# Patient Record
Sex: Male | Born: 1950 | Race: White | Hispanic: No | Marital: Married | Smoking: Former smoker
Health system: Southern US, Community
[De-identification: ages and names within clinical notes are randomized; demographics above are authoritative.]

## PROBLEM LIST (undated history)

## (undated) DIAGNOSIS — I2510 Atherosclerotic heart disease of native coronary artery without angina pectoris: Secondary | ICD-10-CM

## (undated) DIAGNOSIS — K625 Hemorrhage of anus and rectum: Secondary | ICD-10-CM

## (undated) DIAGNOSIS — J449 Chronic obstructive pulmonary disease, unspecified: Secondary | ICD-10-CM

## (undated) DIAGNOSIS — K219 Gastro-esophageal reflux disease without esophagitis: Secondary | ICD-10-CM

## (undated) DIAGNOSIS — E119 Type 2 diabetes mellitus without complications: Secondary | ICD-10-CM

## (undated) DIAGNOSIS — I10 Essential (primary) hypertension: Secondary | ICD-10-CM

## (undated) DIAGNOSIS — E669 Obesity, unspecified: Secondary | ICD-10-CM

## (undated) DIAGNOSIS — D751 Secondary polycythemia: Secondary | ICD-10-CM

## (undated) DIAGNOSIS — C801 Malignant (primary) neoplasm, unspecified: Secondary | ICD-10-CM

## (undated) DIAGNOSIS — K5790 Diverticulosis of intestine, part unspecified, without perforation or abscess without bleeding: Secondary | ICD-10-CM

## (undated) DIAGNOSIS — I219 Acute myocardial infarction, unspecified: Secondary | ICD-10-CM

## (undated) DIAGNOSIS — E785 Hyperlipidemia, unspecified: Secondary | ICD-10-CM

## (undated) HISTORY — PX: BACK SURGERY: SHX140

## (undated) HISTORY — DX: Obesity, unspecified: E66.9

## (undated) HISTORY — DX: Essential (primary) hypertension: I10

## (undated) HISTORY — DX: Hyperlipidemia, unspecified: E78.5

## (undated) HISTORY — DX: Secondary polycythemia: D75.1

## (undated) HISTORY — DX: Gastro-esophageal reflux disease without esophagitis: K21.9

## (undated) HISTORY — DX: Acute myocardial infarction, unspecified: I21.9

## (undated) HISTORY — PX: CARDIAC CATHETERIZATION: SHX172

## (undated) HISTORY — PX: OTHER SURGICAL HISTORY: SHX169

## (undated) HISTORY — DX: Atherosclerotic heart disease of native coronary artery without angina pectoris: I25.10

---

## 2005-07-26 HISTORY — PX: SHOULDER SURGERY: SHX246

## 2010-05-26 ENCOUNTER — Ambulatory Visit: Payer: Self-pay | Admitting: Internal Medicine

## 2010-05-26 ENCOUNTER — Inpatient Hospital Stay (HOSPITAL_COMMUNITY): Admission: EM | Admit: 2010-05-26 | Discharge: 2010-05-28 | Payer: Self-pay | Admitting: Emergency Medicine

## 2010-05-27 ENCOUNTER — Encounter: Payer: Self-pay | Admitting: Internal Medicine

## 2010-06-11 ENCOUNTER — Encounter: Payer: Self-pay | Admitting: Cardiovascular Disease

## 2010-06-11 ENCOUNTER — Ambulatory Visit: Payer: Self-pay | Admitting: Physician Assistant

## 2010-06-11 DIAGNOSIS — F17200 Nicotine dependence, unspecified, uncomplicated: Secondary | ICD-10-CM

## 2010-06-11 DIAGNOSIS — Z8679 Personal history of other diseases of the circulatory system: Secondary | ICD-10-CM

## 2010-06-11 DIAGNOSIS — I2510 Atherosclerotic heart disease of native coronary artery without angina pectoris: Secondary | ICD-10-CM

## 2010-06-11 DIAGNOSIS — I214 Non-ST elevation (NSTEMI) myocardial infarction: Secondary | ICD-10-CM | POA: Insufficient documentation

## 2010-06-11 DIAGNOSIS — E785 Hyperlipidemia, unspecified: Secondary | ICD-10-CM | POA: Insufficient documentation

## 2010-06-11 DIAGNOSIS — K219 Gastro-esophageal reflux disease without esophagitis: Secondary | ICD-10-CM | POA: Insufficient documentation

## 2010-06-11 DIAGNOSIS — I10 Essential (primary) hypertension: Secondary | ICD-10-CM | POA: Insufficient documentation

## 2010-06-17 ENCOUNTER — Ambulatory Visit: Payer: Self-pay | Admitting: Pulmonary Disease

## 2010-06-17 DIAGNOSIS — I10 Essential (primary) hypertension: Secondary | ICD-10-CM | POA: Insufficient documentation

## 2010-06-17 DIAGNOSIS — I219 Acute myocardial infarction, unspecified: Secondary | ICD-10-CM | POA: Insufficient documentation

## 2010-06-17 DIAGNOSIS — G4733 Obstructive sleep apnea (adult) (pediatric): Secondary | ICD-10-CM

## 2010-06-23 ENCOUNTER — Telehealth (INDEPENDENT_AMBULATORY_CARE_PROVIDER_SITE_OTHER): Payer: Self-pay | Admitting: *Deleted

## 2010-06-29 ENCOUNTER — Telehealth (INDEPENDENT_AMBULATORY_CARE_PROVIDER_SITE_OTHER): Payer: Self-pay | Admitting: *Deleted

## 2010-07-17 ENCOUNTER — Ambulatory Visit (HOSPITAL_BASED_OUTPATIENT_CLINIC_OR_DEPARTMENT_OTHER)
Admission: RE | Admit: 2010-07-17 | Discharge: 2010-07-17 | Payer: Self-pay | Source: Home / Self Care | Attending: Pulmonary Disease | Admitting: Pulmonary Disease

## 2010-07-17 ENCOUNTER — Encounter: Payer: Self-pay | Admitting: Pulmonary Disease

## 2010-07-30 ENCOUNTER — Telehealth (INDEPENDENT_AMBULATORY_CARE_PROVIDER_SITE_OTHER): Payer: Self-pay | Admitting: *Deleted

## 2010-07-31 ENCOUNTER — Ambulatory Visit
Admission: RE | Admit: 2010-07-31 | Discharge: 2010-07-31 | Payer: Self-pay | Source: Home / Self Care | Attending: Internal Medicine | Admitting: Internal Medicine

## 2010-07-31 ENCOUNTER — Other Ambulatory Visit: Payer: Self-pay | Admitting: Internal Medicine

## 2010-07-31 ENCOUNTER — Telehealth: Payer: Self-pay | Admitting: Internal Medicine

## 2010-07-31 LAB — LIPID PANEL
Cholesterol: 113 mg/dL (ref 0–200)
HDL: 25.5 mg/dL — ABNORMAL LOW (ref >39.00)
Total CHOL/HDL Ratio: 4
Triglycerides: 399 mg/dL — ABNORMAL HIGH (ref 0.0–149.0)
VLDL: 79.8 mg/dL — ABNORMAL HIGH (ref 0.0–40.0)

## 2010-07-31 LAB — HEPATIC FUNCTION PANEL
ALT: 31 U/L (ref 0–53)
AST: 23 U/L (ref 0–37)
Albumin: 4.1 g/dL (ref 3.5–5.2)
Alkaline Phosphatase: 101 U/L (ref 39–117)
Bilirubin, Direct: 0.1 mg/dL (ref 0.0–0.3)
Total Bilirubin: 0.7 mg/dL (ref 0.3–1.2)
Total Protein: 6.9 g/dL (ref 6.0–8.3)

## 2010-07-31 LAB — LDL CHOLESTEROL, DIRECT: Direct LDL: 52 mg/dL

## 2010-08-04 ENCOUNTER — Ambulatory Visit
Admission: RE | Admit: 2010-08-04 | Discharge: 2010-08-04 | Payer: Self-pay | Source: Home / Self Care | Attending: Pulmonary Disease | Admitting: Pulmonary Disease

## 2010-08-25 NOTE — Progress Notes (Signed)
Summary: schedule sleep study   Phone Note Call from Patient Call back at Home Phone [phone]   Caller: Patient Call For: clance Summary of Call: Pt states KC wants him to schedule a sleep study. Initial call taken by: Darletta Moll,  June 23, 2010 2:37 PM  Follow-up for Phone Call        Per pt instructions from Ov on 11.23.11, KC would recommend a sleep study to evaluate for sleep apnea.  Pt to let [facility] know when he would like to schedule this.   Called, spoke with pt.  He states he is ready to schedule sleep study.  Requesting this be done on a Friday night.  Aware KC out of office today but will return tomorrow.  He is ok with this and requesting call back tomorrow after 2:30pm.  Dr. Shelle Iron, pls advise.  Thanks! Follow-up by: Gweneth Dimitri RN,  June 23, 2010 3:12 PM  Additional Follow-up for Phone Call Additional follow up Details #1::        order sent for npsg, but let him know very unlikely he can do it this friday. Additional Follow-up by: Barbaraann Share MD,  June 24, 2010 6:50 AM    Additional Follow-up for Phone Call Additional follow up Details #2::    [facility] Gweneth Dimitri RN  June 24, 2010 8:43 AM [facility].Michel Bickers CMA  June 25, 2010 10:07 AM [facility] to inform pt order sent for npsg.  This is the 3rd attempt to contact pt.  Per triage protocol, will sign off on this note. Gweneth Dimitri RN  June 26, 2010 5:05 PM

## 2010-08-25 NOTE — Cardiovascular Report (Signed)
Summary: [place] [facility]   [facility]   Imported By: Roderic Ovens 06/08/2010 09:23:17  _____________________________________________________________________  External Attachment:    Type:   Image     Comment:   External Document

## 2010-08-25 NOTE — Assessment & Plan Note (Signed)
Summary: EPH/POST MI   Visit Type:  EPH  CC:  no complaints.  History of Present Illness: Primary Cardiologist:  Dr. Arvilla Meres   Jose Burns is a 60 year old male with no prior history of coronary disease who presented to [facility] on November 2 with chest discomfort.  He ruled in for myocardial infarction.  He underwent cardiac catheterization demonstrating severe 2 vessel CAD.  He was treated with a drug-eluting stent to the LAD and second obtuse marginal.  He had residual disease in the first diagonal of 80%.  He would require myoview testing if he has recurrent chest symptoms.  If he has diagonal territory ischemia, then PCI of the diagonal could be consideredl.  A question of sleep apnea was raised during his admission and he has been set up for an outpatient sleep test.  He returns for follow up.  Since discharge, he is doing well.  He states that he has an occasional "twinge" in his chest.  However, he denies any chest discomfort similar to his previous angina.  He has been walking without chest discomfort or shortness of breath.  He denies orthopnea, PND or edema.  He denies syncope.  He is tolerating his medications well.  He has had some vivid dreams.  He has not been contacted about cardiac rehabilitation.  He is no longer smoking.  Current Medications (verified): 1)  Aspirin Ec 325 Mg Tbec (Aspirin) .... Take One Tablet By Mouth Daily 2)  Effient 10 Mg Tabs (Prasugrel Hcl) .... Take One Tablet By Mouth Daily 3)  Metoprolol Succinate 25 Mg Xr24h-Tab (Metoprolol Succinate) .... Take One Tablet By Mouth Daily 4)  Lipitor 80 Mg Tabs (Atorvastatin Calcium) .... Take One Tablet By Mouth Daily. 5)  Nitrostat 0.4 Mg Subl (Nitroglycerin) .Marland Kitchen.. 1 Tablet Under Tongue At Onset of Chest Pain; You May Repeat Every 5 Minutes For Up To 3 Doses.  Allergies (verified): No Known Drug Allergies  Past History:  Past Medical History: CAD    a.  s/p Non-ST-segment elevation  myocardial infarction 05/27/2010    b.  NSTEMI tx'd with Promus DES to LAD and Promus DES to OM2    c.  residual at cath 05/2010:  OM3 70% (small); D1 80%; CFx 40-50%; EF 55% Hyperlipidemia.  h/o Polycythemia with question of sleep apnea.  Obesity.  Gastroesophageal reflux disease.   Past Surgical History: Reviewed history from 06/10/2010 and no changes required. S/P back surgery 2009 S/P shoulder surgery 2007  Family History: Reviewed history from 06/10/2010 and no changes required. Negative for premature diagnosis of coronary artery disease.   Social History: Reviewed history from 06/10/2010 and no changes required. The patient lives in [place] with his wife  He is a part-time [employer].   He has approximately 80 pack-year smoking history currently smoking 1-1/2 - 2 ppd.  Rarely consumes EtOH  Denies any binge drinking.  No illicit drugs.  No herbal meds.  Regular diet.   No regular exercise.   Review of Systems       He denies leg pain c/w claudication.  Vital Signs:  Patient profile:   60 year old male Height:      70 inches Weight:      227 pounds BMI:     32.69 Pulse rate:   89 / minute BP sitting:   157 / 89  (left arm) Cuff size:   regular  Vitals Entered By: Caralee Ates CMA (June 11, 2010 9:33 AM)  Physical Exam  General:  Well nourished, well developed, in no acute distress HEENT: normal Neck: no JVD Cardiac:  normal S1, S2; RRR; no murmur Lungs:  clear to auscultation bilaterally, no wheezing, rhonchi or rales Abd: soft, nontender, no hepatomegaly Ext: no edema; R radial site without hematoma or bruit Vascular: no carotid  bruits Skin: warm and dry Neuro:  CNs 2-12 intact, no focal abnormalities noted    EKG  Procedure date:  06/11/2010  Findings:      Normal sinus rhythm Heart rate 70 Normal axis Poor R-wave progression No ischemic changes  Impression & Recommendations:  Problem # 1:  ACUT MI SUBENDOCARDIAL INFARCT EPIS CARE UNS  (ICD-410.70) Doing well post myocardial infarction. Continue aspirin and Effient.  He will need to remain on dual antiplatelet therapy for a minimum of one year. We will give him an order to start cardiac rehabilitation at the hospital in [place] where he lives. He will be given a note to return to work next week.  Orders: EKG w/ Interpretation (93000)  Problem # 2:  CORONARY ATHEROSCLEROSIS NATIVE CORONARY ARTERY (ICD-414.01)  No anginal symptoms.  Problem # 3:  HYPERLIPIDEMIA (ICD-272.4)  Check FLP and LFTs in 6 weeks.  His updated medication list for this problem includes:    Lipitor 80 Mg Tabs (Atorvastatin calcium) .Marland Kitchen... Take one tablet by mouth daily.  Problem # 4:  RISK OF SLEEP APNEA (ICD-V12.59)  Sleep study pending  Problem # 5:  ESSENTIAL HYPERTENSION, BENIGN (ICD-401.1)  Increase Toprol to 50 mg once daily. Consider ACE in future if BP not optimal.  Patient Instructions: 1)  Your physician recommends that you schedule a follow-up appointment in: 6 weeks with Dr. Gala Romney 2)  Your physician recommends that you return for a FASTING lipid profile: Fasting Lipids,LFT's in 6 weeks same day of F/U visit. 3)  Your physician has recommended you make the following change in your medication: Metorolol 50 mg. Take one tablet daily. 4)  Cardiac rehab. in [place]. Prescription given. Prescriptions: NITROSTAT 0.4 MG SUBL (NITROGLYCERIN) 1 tablet under tongue at onset of chest pain; you may repeat every 5 minutes for up to 3 doses.  #25 x 3   Entered by:   Ollen Gross, RN, BSN   Authorized by:   Tereso Newcomer PA-C   Signed by:   Ollen Gross, RN, BSN on 06/11/2010   Method used:   Print then Give to Patient   RxID:   [id number] LIPITOR 80 MG TABS (ATORVASTATIN CALCIUM) Take one tablet by mouth daily.  #90 x 3   Entered by:   Ollen Gross, RN, BSN   Authorized by:   Tereso Newcomer PA-C   Signed by:   Ollen Gross, RN, BSN on 06/11/2010   Method used:    Print then Give to Patient   RxID:   [id number] EFFIENT 10 MG TABS (PRASUGREL HCL) Take one tablet by mouth daily  #90 x 3   Entered by:   Ollen Gross, RN, BSN   Authorized by:   Tereso Newcomer PA-C   Signed by:   Ollen Gross, RN, BSN on 06/11/2010   Method used:   Print then Give to Patient   RxID:   [id number] METOPROLOL SUCCINATE 50 MG XR24H-TAB (METOPROLOL SUCCINATE) Take one tablet by mouth daily  #90 x 6   Entered by:   Ollen Gross, RN, BSN   Authorized by:   Tereso Newcomer PA-C   Signed by:   Ollen Gross, RN, BSN on 06/11/2010  Method used:   Print then Give to Patient   RxID:   [id number]  I have personally reviewed the prescriptions today for accuracy.Tereso Newcomer PA-C  June 11, 2010 5:05 PM

## 2010-08-25 NOTE — Letter (Signed)
Summary: Return To Work  [employer], Main Office  [address]   [place], [place] [address]   Phone: [phone]  Fax: [phone]    06/11/2010  TO: Leodis Sias IT MAY CONCERN   RE: Jose Burns [address] [id number]   The above named individual is under my medical care and may return to work [id number] 06/15/10  If you have any further questions or need additional information, please call.     Sincerely,    Ollen Gross, RN, BSN

## 2010-08-25 NOTE — Progress Notes (Signed)
Summary: returning call-pt aware PSG sched  Phone Note Call from Patient Call back at Home Phone [phone]   Caller: Patient Call For: clance Summary of Call: Returning call. Initial call taken by: Darletta Moll,  June 29, 2010 2:37 PM  Follow-up for Phone Call        I called and spoke with pt.  He states that he is already aware of sleep study date and time and denied any questions/concerns.  Nothing needed. Follow-up by: Vernie Murders,  June 29, 2010 3:22 PM

## 2010-08-25 NOTE — Assessment & Plan Note (Signed)
Summary: consult for possible osa   Visit Type:  Initial Consult Copy to:  Arvilla Meres MD Primary Provider/Referring Provider:  none per pt  CC:  sleep consult. Pt wife states he snores bad.  History of Present Illness: The pt is a 60y/o male who I have been asked to see for possible osa.  He has recently had a MI requiring PCI, and has a longstanding h/o snoring with abnormal breathing pattern during sleep.  This was apparently noticed in the past after back surgery, and with his recent hospitalization.  His wife has witnessed this at times while napping on the couch.  He is a loud snorer per wife.  He typically goes to bed at 10-11pm, and arises at 6am to start his day.  He awakens 2-3 times a night, but feels he is adequately rested in the am's upon arising.  He is not sleepy during the day while active, but does note sleep pressure and dozing during the day with periods of inactivity.  He also notes some sleep pressure driving  more than a few hours.  His epworth score today is only 8, and he tells me his weight is up only 5 pounds over the last 2 years.  Current Medications (verified): 1)  Aspirin Ec 325 Mg Tbec (Aspirin) .... Take One Tablet By Mouth Daily 2)  Effient 10 Mg Tabs (Prasugrel Hcl) .... Take One Tablet By Mouth Daily 3)  Lipitor 80 Mg Tabs (Atorvastatin Calcium) .... Take One Tablet By Mouth Daily. 4)  Nitrostat 0.4 Mg Subl (Nitroglycerin) .Marland Kitchen.. 1 Tablet Under Tongue At Onset of Chest Pain; You May Repeat Every 5 Minutes For Up To 3 Doses. 5)  Metoprolol Succinate 50 Mg Xr24h-Tab (Metoprolol Succinate) .... Take One Tablet By Mouth Daily  Allergies (verified): No Known Drug Allergies  Past History:  Past Medical History: CAD    a.  s/p Non-ST-segment elevation myocardial infarction 05/27/2010    b.  NSTEMI tx'd with Promus DES to LAD and Promus DES to OM2    c.  residual at cath 05/2010:  OM3 70% (small); D1 80%; CFx 40-50%; EF 55% Hyperlipidemia.  h/o Polycythemia  with question of sleep apnea.  Obesity.  Gastroesophageal reflux disease.  Hypertension Myocardial Infarction  Past Surgical History: S/P back surgery x2 S/P shoulder surgery 2007 stents x2  Family History: Reviewed history from 06/11/2010 and no changes required. Negative for premature diagnosis of coronary artery disease.  Father: Deceased age 47 - [place] Mother: Alive age 89 Sister: Deceased age 51 - Heart Attach breast cancer--mother  Social History: Reviewed history from 06/11/2010 and no changes required. The patient lives in [place] with his wife  He is a part-time [employer] architect.   quit smoking 05/26/2010. 1/2-1 ppd. started 1970's.  Rarely consumes EtOH  Denies any binge drinking.  No illicit drugs.  No herbal meds.  Regular diet.   No regular exercise.  Tobacco Use - Former.  Retired  Part Time  Married   Review of Systems  The patient denies shortness of breath with activity, shortness of breath at rest, productive cough, non-productive cough, coughing up blood, chest pain, irregular heartbeats, acid heartburn, indigestion, loss of appetite, weight change, abdominal pain, difficulty swallowing, sore throat, tooth/dental problems, headaches, nasal congestion/difficulty breathing through nose, sneezing, itching, ear ache, anxiety, depression, hand/feet swelling, joint stiffness or pain, rash, change in color of mucus, and fever.    Vital Signs:  Patient profile:   60 year old male Height:  70 inches Weight:      227.38 pounds O2 Sat:      93 % on Room air Temp:     97.8 degrees F oral Pulse rate:   69 / minute BP sitting:   112 / 66  (left arm) Cuff size:   regular  Vitals Entered By: Carver Fila (June 17, 2010 1:49 PM)  O2 Flow:  Room air CC: sleep consult. Pt wife states he snores bad Comments meds and allergies updated Phone number updated Carver Fila  June 17, 2010 1:50 PM    Physical Exam  General:  ow male in nad Eyes:  PERRLA and  EOMI.   Nose:  mildly narrowed but patent Mouth:  long palate and uvula, small posterior pharyngeal space. Neck:  no jvd, tmg, LN Lungs:  clear to auscultation Heart:  rrr, no mrg Abdomen:  soft and nontender, bs+ Extremities:  no significant edema, no cyanosis  pulses intact distally Neurologic:  alert and oriented, moves all 4.   Impression & Recommendations:  Problem # 1:  OBSTRUCTIVE SLEEP APNEA (ICD-327.23)  the pt's history is very suggestive of osa.  He is overweight with a large neck and abnormal UA anatomy, has known loud snoring and witnessed abnormal breathing pattern during sleep, and finally has EDS with periods of inactivity.  He has underlying CV disease, and would benefit from treatment if he does indeed have osa.  I have had a long discussion with the pt about sleep apnea, including its impact on QOL and CV health.  I have recommended a sleep study to the pt, but he feels he is too busy to schedule for now.  He would be willing to do it after the first of the year, so I have asked him to call me to schedule.  I have also encouraged him to work on weight loss.  Other Orders: Consultation Level IV (04540)  Patient Instructions: 1)  would recommend a sleep study to evaluate for sleep apnea.  Please let me know when you would like to schedule. 2)  work on weight loss

## 2010-08-27 NOTE — Progress Notes (Signed)
Summary: need ov with KC---LMTCBX1-pt returned call  Phone Note Outgoing Call Call back at [facility] Phone [phone]   Call placed by: Arman Filter LPN,  July 30, 2010 9:30 AM Call placed to: Patient Summary of Call: pt needs ov with KC to discuss sleep study results.  LMOMTCBX1.   Can you hold slots on the following days..... Wed Jan 18 at 3:15pm Thurs Jan 19 at 10:45 am, 11:30 am, or 3:30pm  Arman Filter LPN  July 30, 2010 9:32 AM  Initial call taken by: Arman Filter LPN,  July 30, 2010 9:32 AM  Follow-up for Phone Call        pt returned call from [place]. Tivis Ringer, CNA  July 30, 2010 2:44 PM  pt scheduled to see [facility] next Tuesday Jan 10 at 10:45am.  Arman Filter LPN  July 30, 2010 4:49 PM

## 2010-08-27 NOTE — Assessment & Plan Note (Signed)
Summary: rov for review of npsg   Visit Type:  Follow-up Copy to:  Arvilla Meres MD Primary Provider/Referring Provider:  none per pt  CC:  Follow-up sleep test.  History of Present Illness: The pt comes in today for f/u of his recent sleep study.  He was found to have very mild osa, with an AHI of 7/hr and desat transiently as low as 81%.  I have reviewed the study in detail with him, and answered all of his questions.  Current Medications (verified): 1)  Aspirin Ec 325 Mg Tbec (Aspirin) .... Take One Tablet By Mouth Daily 2)  Effient 10 Mg Tabs (Prasugrel Hcl) .... Take One Tablet By Mouth Daily 3)  Lipitor 80 Mg Tabs (Atorvastatin Calcium) .... Take One Tablet By Mouth Daily. 4)  Nitrostat 0.4 Mg Subl (Nitroglycerin) .Marland Kitchen.. 1 Tablet Under Tongue At Onset of Chest Pain; You May Repeat Every 5 Minutes For Up To 3 Doses. 5)  Metoprolol Succinate 50 Mg Xr24h-Tab (Metoprolol Succinate) .... Take One Tablet By Mouth Daily 6)  Viagra 100 Mg Tabs (Sildenafil Citrate) .... As Needed 7)  Tylenol Extra Strength 500 Mg Tabs (Acetaminophen) .Marland Kitchen.. 1 By Mouth Daily As Needed  Allergies (verified): No Known Drug Allergies  Past History:  Past medical, surgical, family and social histories (including risk factors) reviewed, and no changes noted (except as noted below).  Past Medical History: Reviewed history from 06/17/2010 and no changes required. CAD    a.  s/p Non-ST-segment elevation myocardial infarction 05/27/2010    b.  NSTEMI tx'd with Promus DES to LAD and Promus DES to OM2    c.  residual at cath 05/2010:  OM3 70% (small); D1 80%; CFx 40-50%; EF 55% Hyperlipidemia.  h/o Polycythemia with question of sleep apnea.  Obesity.  Gastroesophageal reflux disease.  Hypertension Myocardial Infarction  Past Surgical History: Reviewed history from 06/17/2010 and no changes required. S/P back surgery x2 S/P shoulder surgery 2007 stents x2  Family History: Reviewed history from  06/17/2010 and no changes required. Negative for premature diagnosis of coronary artery disease.  Father: Deceased age 2 - [place] Mother: Alive age 30 Sister: Deceased age 78 - Heart Attach breast cancer--mother  Social History: Reviewed history from 06/17/2010 and no changes required. The patient lives in [place] with his wife  He is a part-time [employer] architect.   quit smoking 05/26/2010. 1/2-1 ppd. started 1970's.  Rarely consumes EtOH  Denies any binge drinking.  No illicit drugs.  No herbal meds.  Regular diet.   No regular exercise.  Tobacco Use - Former.  Retired  Part Time  Married   Vital Signs:  Patient profile:   60 year old male Height:      70 inches (177.80 cm) Weight:      235.25 pounds (106.93 kg) BMI:     33.88 O2 Sat:      97 % on Room air Temp:     97.8 degrees F (36.56 degrees C) oral Pulse rate:   65 / minute BP sitting:   122 / 80  (left arm) Cuff size:   large  Vitals Entered By: Michel Bickers CMA (August 04, 2010 10:57 AM)  O2 Sat at Rest %:  97 O2 Flow:  Room air CC: Follow-up sleep test Comments Medications reviewed with patient Michel Bickers CMA  August 04, 2010 10:58 AM   Physical Exam  General:  ow male in nad Extremities:  no edema, no cyanosis  Neurologic:  alert, does not appear  sleepy, moves all 4.   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  The good news here is this degree of osa has very little impact on CV health.  I have reviewed the various treatment options for mild osa, including a trial of weight loss alone, upper airway surgery, dental appliance, and cpap.  He really does not have upper airway anatomy that is amenable to surgery, therefore would need to consider the other options. At this point, he would like to work on weight loss alone and see how he does.    Medications Added to Medication List This Visit: 1)  Tylenol Extra Strength  500 Mg Tabs (Acetaminophen) .Marland Kitchen.. 1 by mouth daily as needed  Other Orders: Est. Patient Level III (40981)  Patient Instructions: 1)  work on weight loss 2)  if you wish to treat your sleep apnea more aggressively, would consider dental appliance or cpap.  Just let me know.

## 2010-08-27 NOTE — Progress Notes (Addendum)
Summary: prior auth for viagra  Phone Note Other Incoming   Summary of Call: received form from for prior auth for pts viagra have called [phone] pts ID # [id number] and was denied b/c pt has as needed ntg, explained pt has been counceled and understands not to take meds together, but still they denied they state Dr Gala Romney can send a letter for appeal to [employer] at fax # [phone] Initial call taken by: Meredith Staggers, RN,  July 31, 2010 5:57 PM

## 2010-08-27 NOTE — Assessment & Plan Note (Signed)
Summary: 6wk f/u sl   Visit Type:  Follow-up Referring Provider:  Arvilla Meres MD Primary Provider:  none per pt  CC:  no complaints.  History of Present Illness: Jose Burns is a 60 year old male obesity, HTN, previous tobacco use and CAD. He had NSTEMI in Nov 2011.  He underwent cardiac catheterization demonstrating severe 2 vessel CAD.  He was treated with a drug-eluting stent to the LAD and second obtuse marginal.  He had residual disease in the first diagonal of 80%.  A question of sleep apnea was raised during his admission due to polycythemia and he has been set up for an outpatient sleep test.  He returns for follow up.  Since discharge, he is doing well.  He states that he has an occasional "twinge" in his chest.  However, he denies any chest discomfort similar to his previous angina.  He has been walking without chest discomfort or shortness of breath. Says he is too lazy to check on cardiac rehab. Had his sleeps study and is scheduled to f/u with Dr. Shelle Iron.    He denies orthopnea, PND or edema.  He denies syncope.  He is tolerating his medications well.  He is no longer smoking (67 days). Using nicotine patch. Has gained 5 pounds.  Current Medications (verified): 1)  Aspirin Ec 325 Mg Tbec (Aspirin) .... Take One Tablet By Mouth Daily 2)  Effient 10 Mg Tabs (Prasugrel Hcl) .... Take One Tablet By Mouth Daily 3)  Lipitor 80 Mg Tabs (Atorvastatin Calcium) .... Take One Tablet By Mouth Daily. 4)  Nitrostat 0.4 Mg Subl (Nitroglycerin) .Marland Kitchen.. 1 Tablet Under Tongue At Onset of Chest Pain; You May Repeat Every 5 Minutes For Up To 3 Doses. 5)  Metoprolol Succinate 50 Mg Xr24h-Tab (Metoprolol Succinate) .... Take One Tablet By Mouth Daily  Allergies (verified): No Known Drug Allergies  Review of Systems       As per HPI and past medical history; otherwise all systems negative.   Vital Signs:  Patient profile:   60 year old male Height:      70 inches Weight:      232.25 pounds BMI:      33.44 Pulse rate:   65 / minute BP sitting:   126 / 70  (left arm) Cuff size:   regular  Vitals Entered By: Hardin Negus, RMA (July 31, 2010 9:45 AM)  Physical Exam  General:  Gen: well appearing. no resp difficulty HEENT: normal Neck: supple. no JVD. Carotids 2+ bilat; no bruits. No lymphadenopathy or thryomegaly appreciated. Cor: PMI nondisplaced. Regular rate & rhythm. No rubs, gallops, murmur. Lungs: clear Abdomen:obese. Good bowel sounds. Extremities: no cyanosis, clubbing, rash, edema Neuro: alert & orientedx3, cranial nerves grossly intact. moves all 4 extremities w/o difficulty. affect pleasant    Impression & Recommendations:  Problem # 1:  CORONARY ATHEROSCLEROSIS NATIVE CORONARY ARTERY (ICD-414.01) Doing well. Continue current regimen including Effient.  Encouraged him to go to cardiac rehab.  Problem # 2:  HYPERTENSION (ICD-401.9) Blood pressure well controlled. Continue current regimen.  Problem # 3:  ERECTILE DYSFUNCTION Have prescribed sildenafil. Warned never to take NTG when sildenafil on board. If severe CP with sildenafil on board call 911.   Problem # 4:  OBSTRUCTIVE SLEEP APNEA (ICD-327.23) Suspect he has severe OSA. F/u with Dr. Shelle Iron  Problem # 5:  TOBACCO ABUSE (ICD-305.1) Congratulated him on smoking cessation.  Other Orders: TLB-Lipid Panel (80061-LIPID) TLB-Hepatic/Liver Function Pnl (80076-HEPATIC)  Patient Instructions: 1)  We have sent  you a prescription for Viagra 100mg , please remember to not take nitroglycerin when you have taken this medication. 2)  Your physician recommends referral and attendance at a Cardiac Rehab Program. 3)  Your physician wants you to follow-up in:  6 months.  You will receive a reminder letter in the mail two months in advance. If you don't receive a letter, please call our office to schedule the follow-up appointment. Prescriptions: VIAGRA 100 MG TABS (SILDENAFIL CITRATE) as needed  #8 x 4   Entered by:    Meredith Staggers, RN   Authorized by:   Dolores Patty, MD, [facility]   Signed by:   Meredith Staggers, RN on 07/31/2010   Method used:   Electronically to        [facility] (retail)       [address]       [place], [place]  [address]       Ph: [phone]       Fax: [phone]   RxID:   [id number]

## 2010-10-06 LAB — CBC
HCT: 50.8 % (ref 39.0–52.0)
HCT: 53.5 % — ABNORMAL HIGH (ref 39.0–52.0)
Hemoglobin: 16.9 g/dL (ref 13.0–17.0)
Hemoglobin: 17.9 g/dL — ABNORMAL HIGH (ref 13.0–17.0)
MCH: 32 pg (ref 26.0–34.0)
MCH: 32.8 pg (ref 26.0–34.0)
MCHC: 33.3 g/dL (ref 30.0–36.0)
MCHC: 33.5 g/dL (ref 30.0–36.0)
MCV: 97.9 fL (ref 78.0–100.0)
MCV: 98.2 fL (ref 78.0–100.0)
Platelets: 189 10*3/uL (ref 150–400)
Platelets: 203 10*3/uL (ref 150–400)
RBC: 5.2 MIL/uL (ref 4.22–5.81)
RBC: 5.45 MIL/uL (ref 4.22–5.81)
RDW: 13.6 % (ref 11.5–15.5)
RDW: 13.6 % (ref 11.5–15.5)
WBC: 10 10*3/uL (ref 4.0–10.5)

## 2010-10-06 LAB — POCT CARDIAC MARKERS
CKMB, poc: 1 ng/mL (ref 1.0–8.0)
Myoglobin, poc: 101 ng/mL (ref 12–200)
Troponin i, poc: <0.05 ng/mL (ref 0.00–0.09)

## 2010-10-06 LAB — PLATELET INHIBITION P2Y12
P2Y12 % Inhibition: 97 %
Platelet Function  P2Y12: 7 [PRU] — ABNORMAL LOW (ref 194–418)
Platelet Function Baseline: 216 [PRU] (ref 194–418)

## 2010-10-06 LAB — DIFFERENTIAL
Basophils Absolute: 0.1 10*3/uL (ref 0.0–0.1)
Basophils Relative: 1 % (ref 0–1)
Eosinophils Absolute: 0.3 10*3/uL (ref 0.0–0.7)
Eosinophils Relative: 3 % (ref 0–5)
Lymphocytes Relative: 16 % (ref 12–46)
Lymphs Abs: 1.6 10*3/uL (ref 0.7–4.0)
Monocytes Absolute: 0.8 10*3/uL (ref 0.1–1.0)
Monocytes Relative: 8 % (ref 3–12)
Neutro Abs: 7.2 10*3/uL (ref 1.7–7.7)
Neutrophils Relative %: 72 % (ref 43–77)

## 2010-10-06 LAB — POCT I-STAT, CHEM 8
BUN: 11 mg/dL (ref 6–23)
Calcium, Ion: 1.1 mmol/L — ABNORMAL LOW (ref 1.12–1.32)
Chloride: 102 mEq/L (ref 96–112)
Creatinine, Ser: 1.1 mg/dL (ref 0.4–1.5)
Glucose, Bld: 128 mg/dL — ABNORMAL HIGH (ref 70–99)
HCT: 57 % — ABNORMAL HIGH (ref 39.0–52.0)
Hemoglobin: 19.4 g/dL — ABNORMAL HIGH (ref 13.0–17.0)
Potassium: 4.7 mEq/L (ref 3.5–5.1)
Sodium: 137 meq/L (ref 135–145)
TCO2: 29 mmol/L (ref 0–100)

## 2010-10-06 LAB — BASIC METABOLIC PANEL
GFR calc Af Amer: >60 mL/min (ref >60)
GFR calc non Af Amer: >60 mL/min (ref >60)
Glucose, Bld: 119 mg/dL — ABNORMAL HIGH (ref 70–99)
Potassium: 3.8 mEq/L (ref 3.5–5.1)
Sodium: 137 mEq/L (ref 135–145)

## 2010-10-06 LAB — LIPID PANEL
Total CHOL/HDL Ratio: 5.3 RATIO
VLDL: 40 mg/dL (ref 0–40)

## 2010-10-06 LAB — HEPATIC FUNCTION PANEL
ALT: 22 U/L (ref 0–53)
AST: 22 U/L (ref 0–37)
Albumin: 4 g/dL (ref 3.5–5.2)
Alkaline Phosphatase: 88 U/L (ref 39–117)
Bilirubin, Direct: 0.1 mg/dL (ref 0.0–0.3)
Indirect Bilirubin: 0.8 mg/dL (ref 0.3–0.9)
Total Bilirubin: 0.9 mg/dL (ref 0.3–1.2)
Total Protein: 7 g/dL (ref 6.0–8.3)

## 2010-10-06 LAB — CARDIAC PANEL(CRET KIN+CKTOT+MB+TROPI)
CK, MB: 2.5 ng/mL (ref 0.3–4.0)
CK, MB: 4.5 ng/mL — ABNORMAL HIGH (ref 0.3–4.0)
Total CK: 130 U/L (ref 7–232)
Total CK: 135 U/L (ref 7–232)
Troponin I: 0.12 ng/mL — ABNORMAL HIGH (ref 0.00–0.06)

## 2010-10-06 LAB — HEPARIN LEVEL (UNFRACTIONATED)
Heparin Unfractionated: <0.1 IU/mL — ABNORMAL LOW (ref 0.30–0.70)
Heparin Unfractionated: <0.1 IU/mL — ABNORMAL LOW (ref 0.30–0.70)

## 2010-10-06 LAB — HEMOGLOBIN A1C
Hgb A1c MFr Bld: 5.9 % — ABNORMAL HIGH (ref <5.7)
Mean Plasma Glucose: 123 mg/dL — ABNORMAL HIGH (ref <117)

## 2010-10-06 LAB — CK TOTAL AND CKMB (NOT AT ARMC)
CK, MB: 4.4 ng/mL — ABNORMAL HIGH (ref 0.3–4.0)
Relative Index: 2.5 (ref 0.0–2.5)
Total CK: 173 U/L (ref 7–232)

## 2010-10-06 LAB — APTT: aPTT: 31 seconds (ref 24–37)

## 2010-10-06 LAB — PROTIME-INR: INR: 0.99 (ref 0.00–1.49)

## 2010-10-06 LAB — TROPONIN I: Troponin I: 0.14 ng/mL — ABNORMAL HIGH (ref 0.00–0.06)

## 2010-10-09 ENCOUNTER — Other Ambulatory Visit (INDEPENDENT_AMBULATORY_CARE_PROVIDER_SITE_OTHER): Payer: BC Managed Care – PPO

## 2010-10-09 ENCOUNTER — Other Ambulatory Visit: Payer: Self-pay | Admitting: Internal Medicine

## 2010-10-09 ENCOUNTER — Encounter: Payer: Self-pay | Admitting: Internal Medicine

## 2010-10-09 DIAGNOSIS — Z79899 Other long term (current) drug therapy: Secondary | ICD-10-CM

## 2010-10-09 DIAGNOSIS — E785 Hyperlipidemia, unspecified: Secondary | ICD-10-CM

## 2010-10-09 LAB — HEPATIC FUNCTION PANEL
Alkaline Phosphatase: 97 U/L (ref 39–117)
Bilirubin, Direct: 0.1 mg/dL (ref 0.0–0.3)
Total Bilirubin: 0.5 mg/dL (ref 0.3–1.2)
Total Protein: 6.9 g/dL (ref 6.0–8.3)

## 2010-10-09 LAB — LIPID PANEL
HDL: 28 mg/dL — ABNORMAL LOW (ref >39.00)
Total CHOL/HDL Ratio: 4
Triglycerides: 377 mg/dL — ABNORMAL HIGH (ref 0.0–149.0)
VLDL: 75.4 mg/dL — ABNORMAL HIGH (ref 0.0–40.0)

## 2010-10-09 LAB — LDL CHOLESTEROL, DIRECT: Direct LDL: 45.6 mg/dL

## 2010-10-22 ENCOUNTER — Telehealth: Payer: Self-pay | Admitting: Internal Medicine

## 2010-10-22 DIAGNOSIS — E785 Hyperlipidemia, unspecified: Secondary | ICD-10-CM

## 2010-10-22 NOTE — Telephone Encounter (Signed)
Pt rtn call re lab work results

## 2010-10-22 NOTE — Telephone Encounter (Signed)
Pt given lab results, he will increase fish oil as directed and will have rechecked on 4/27

## 2010-11-20 ENCOUNTER — Other Ambulatory Visit (INDEPENDENT_AMBULATORY_CARE_PROVIDER_SITE_OTHER): Payer: BC Managed Care – PPO | Admitting: *Deleted

## 2010-11-20 ENCOUNTER — Other Ambulatory Visit (INDEPENDENT_AMBULATORY_CARE_PROVIDER_SITE_OTHER): Payer: BC Managed Care – PPO | Admitting: Internal Medicine

## 2010-11-20 DIAGNOSIS — E785 Hyperlipidemia, unspecified: Secondary | ICD-10-CM

## 2010-11-20 LAB — LIPID PANEL: Cholesterol: 113 mg/dL (ref 0–200)

## 2010-11-20 LAB — HEPATIC FUNCTION PANEL
ALT: 40 U/L (ref 0–53)
AST: 32 U/L (ref 0–37)
Albumin: 4.1 g/dL (ref 3.5–5.2)
Alkaline Phosphatase: 88 U/L (ref 39–117)
Bilirubin, Direct: 0.1 mg/dL (ref 0.0–0.3)
Total Protein: 7.1 g/dL (ref 6.0–8.3)

## 2010-11-20 LAB — BASIC METABOLIC PANEL
BUN: 17 mg/dL (ref 6–23)
Creatinine, Ser: 1 mg/dL (ref 0.4–1.5)
GFR: 77.5 mL/min (ref >60.00)
Glucose, Bld: 115 mg/dL — ABNORMAL HIGH (ref 70–99)

## 2010-11-20 LAB — LDL CHOLESTEROL, DIRECT: Direct LDL: 53.2 mg/dL

## 2011-01-04 ENCOUNTER — Encounter: Payer: Self-pay | Admitting: Internal Medicine

## 2011-02-19 ENCOUNTER — Encounter: Payer: Self-pay | Admitting: Internal Medicine

## 2011-02-23 ENCOUNTER — Ambulatory Visit: Payer: BC Managed Care – PPO | Admitting: Internal Medicine

## 2011-02-24 ENCOUNTER — Ambulatory Visit: Payer: BC Managed Care – PPO | Admitting: Internal Medicine

## 2011-02-25 ENCOUNTER — Encounter: Payer: Self-pay | Admitting: Internal Medicine

## 2011-02-25 ENCOUNTER — Ambulatory Visit (INDEPENDENT_AMBULATORY_CARE_PROVIDER_SITE_OTHER): Payer: BC Managed Care – PPO | Admitting: Internal Medicine

## 2011-02-25 VITALS — BP 122/80 | HR 59 | Resp 18 | Ht 70.0 in | Wt 239.8 lb

## 2011-02-25 DIAGNOSIS — I2510 Atherosclerotic heart disease of native coronary artery without angina pectoris: Secondary | ICD-10-CM

## 2011-02-25 DIAGNOSIS — E785 Hyperlipidemia, unspecified: Secondary | ICD-10-CM

## 2011-02-25 MED ORDER — EQL FISH OIL 1000 MG PO CAPS: 6.0000 | ORAL_CAPSULE | Freq: Two times a day (BID) | ORAL | Status: DC

## 2011-02-25 NOTE — Progress Notes (Signed)
PCP: Frederica Kuster [facility])  HPI:  Jose Burns is a 60 year old male obesity, HTN, previous tobacco use and CAD. He had NSTEMI in Nov 2011.  He underwent cardiac catheterization demonstrating severe 2 vessel CAD.  He was treated with a drug-eluting stent to the LAD and second obtuse marginal.  He had residual disease in the first diagonal of 80%.    Recently underwent sleep study which showed mild OSA.   He states that he has an occasional "twinge" in his chest.  However, he denies any chest discomfort similar to his previous angina. Never went to cardiac rehab. Not exercising at all.   Lipids in 4/12: TC 113 HDL 30 TG 225 LDL 52 Fasting glucose 115  He denies orthopnea, PND or edema.  He denies syncope.  He is tolerating his medications well.  He is no longer smoking (quit Nov 2011). Planning on starting a diet.   ROS: All systems negative except as listed in HPI, PMH and Problem List.  Past Medical History  Diagnosis Date  . CAD (coronary artery disease)     s/p Non-ST-segment elevation myocardial infarction 05/27/2010; NSTEMI tx'd with Promus DES to LAD and Promus DES to OM2;  residual at cath 05/2010:  OM3 70% (small); D1 80%; CFx 40-50%; EF 55%  . Hyperlipidemia   . Polycythemia     with question of sleep apnea  . Obesity   . GERD (gastroesophageal reflux disease)   . HTN (hypertension)   . MI (myocardial infarction)     Current Outpatient Prescriptions  Medication Sig Dispense Refill  . acetaminophen (TYLENOL) 500 MG tablet One by mouth daily as needed       . aspirin EC 325 MG tablet Take 325 mg by mouth daily.        Marland Kitchen atorvastatin (LIPITOR) 80 MG tablet Take 80 mg by mouth daily.        . metoprolol (TOPROL-XL) 50 MG 24 hr tablet Take 50 mg by mouth daily.        . nitroGLYCERIN (NITROSTAT) 0.4 MG SL tablet Place 0.4 mg under the tongue every 5 (five) minutes as needed.        . Omega-3 Fatty Acids (EQL FISH OIL) 1000 MG CAPS Take 4 capsules by mouth daily.       . prasugrel  (EFFIENT) 10 MG TABS Take 10 mg by mouth daily.        . sildenafil (VIAGRA) 100 MG tablet Take 100 mg by mouth daily as needed.           PHYSICAL EXAM: Filed Vitals:   02/25/11 1115  BP: 122/80  Pulse: 59  Resp: 18   General:  Well appearing. No resp difficulty HEENT: normal Neck: supple. JVP flat. Carotids 2+ bilaterally; no bruits. No lymphadenopathy or thryomegaly appreciated. Cor: PMI normal. Regular rate & rhythm. No rubs, gallops or murmurs. Lungs: clear Abdomen: obese. soft, nontender, nondistended. No hepatosplenomegaly. No bruits or masses. Good bowel sounds. Extremities: no cyanosis, clubbing, rash. tr edema Neuro: alert & orientedx3, cranial nerves grossly intact. Moves all 4 extremities w/o difficulty. Affect pleasant.    ECG: Sinus bradycardia 59 No ST-T wave abnormalities.     ASSESSMENT & PLAN:

## 2011-02-25 NOTE — Assessment & Plan Note (Signed)
No evidence of ischemia. Continue current regimen. Encouraged him to exercise and lose weight.

## 2011-02-25 NOTE — Assessment & Plan Note (Addendum)
LDL < 70. However has prediabetes with insulin resistance. Strongly suggested need for weight loss and exercise. Will increase fish oil to 3 bid. Wife is diabetic educator (raised daughter with DM1) and will help him. Asked him to f/u with PCP for further management.

## 2011-02-25 NOTE — Patient Instructions (Signed)
Increase Fish Oil to 3 tabs Twice daily   Your physician discussed the importance of regular exercise and recommended that you start or continue a regular exercise program for good health.  Your physician wants you to follow-up in: 6 months. You will receive a reminder letter in the mail two months in advance. If you don't receive a letter, please call our office to schedule the follow-up appointment.

## 2011-07-06 ENCOUNTER — Other Ambulatory Visit: Payer: Self-pay | Admitting: Internal Medicine

## 2011-07-06 ENCOUNTER — Telehealth: Payer: Self-pay | Admitting: Internal Medicine

## 2011-07-06 ENCOUNTER — Other Ambulatory Visit: Payer: Self-pay

## 2011-07-06 MED ORDER — METOPROLOL SUCCINATE ER 50 MG PO TB24: 50.0000 mg | ORAL_TABLET | Freq: Every day | ORAL | Status: DC

## 2011-07-06 MED ORDER — PRASUGREL HCL 10 MG PO TABS: 10.0000 mg | ORAL_TABLET | Freq: Every day | ORAL | Status: DC

## 2011-07-06 MED ORDER — NITROGLYCERIN 0.4 MG SL SUBL: 0.4000 mg | SUBLINGUAL_TABLET | SUBLINGUAL | Status: DC | PRN

## 2011-07-06 MED ORDER — ATORVASTATIN CALCIUM 80 MG PO TABS: 80.0000 mg | ORAL_TABLET | Freq: Every day | ORAL | Status: DC

## 2011-07-06 NOTE — Telephone Encounter (Signed)
New Msg: Pt wants RX's mailed to pt. Pt pharamcy is going to change beginning of the year and would like to have the RX's he needs prior to the beginning of the year.   Pt also would like a 3 month supply of the medications pt is currently requesting refills of.   Please return pt call to discuss further.

## 2011-07-06 NOTE — Telephone Encounter (Signed)
Pt is requesting a written rx for 30 days of all his rx.  He states his insurance is changing but he is almost out so is requesting 30 days with refills.  He also states he was told that he needs to discontinue one of them a year after his MI which was in November 2011 but doesn't remember which one.  I told him I would check with Dr Gala Romney and call him back after 2:00pm.

## 2011-07-06 NOTE — Telephone Encounter (Signed)
New Msg: Pt calling to schedule appt with Dr. Gala Romney and wants to know if and when pt can be seen. Please return pt call to discuss further.

## 2011-07-07 ENCOUNTER — Other Ambulatory Visit: Payer: Self-pay

## 2011-07-07 MED ORDER — METOPROLOL SUCCINATE ER 50 MG PO TB24: 50.0000 mg | ORAL_TABLET | Freq: Every day | ORAL | Status: DC

## 2011-07-07 MED ORDER — PRASUGREL HCL 10 MG PO TABS: 10.0000 mg | ORAL_TABLET | Freq: Every day | ORAL | Status: DC

## 2011-07-07 MED ORDER — ATORVASTATIN CALCIUM 80 MG PO TABS: 80.0000 mg | ORAL_TABLET | Freq: Every day | ORAL | Status: DC

## 2011-07-07 MED ORDER — NITROGLYCERIN 0.4 MG SL SUBL: 0.4000 mg | SUBLINGUAL_TABLET | SUBLINGUAL | Status: DC | PRN

## 2011-07-07 MED ORDER — ASPIRIN EC 325 MG PO TBEC: 325.0000 mg | DELAYED_RELEASE_TABLET | Freq: Every day | ORAL | Status: DC

## 2011-07-07 MED ORDER — SILDENAFIL CITRATE 100 MG PO TABS: 100.0000 mg | ORAL_TABLET | Freq: Every day | ORAL | Status: DC | PRN

## 2011-08-19 ENCOUNTER — Institutional Professional Consult (permissible substitution): Payer: BC Managed Care – PPO | Admitting: Cardiovascular Disease

## 2011-08-24 ENCOUNTER — Encounter: Payer: Self-pay | Admitting: Cardiovascular Disease

## 2011-08-24 ENCOUNTER — Ambulatory Visit (INDEPENDENT_AMBULATORY_CARE_PROVIDER_SITE_OTHER): Payer: BC Managed Care – PPO | Admitting: Cardiovascular Disease

## 2011-08-24 DIAGNOSIS — I2510 Atherosclerotic heart disease of native coronary artery without angina pectoris: Secondary | ICD-10-CM

## 2011-08-24 DIAGNOSIS — E785 Hyperlipidemia, unspecified: Secondary | ICD-10-CM

## 2011-08-24 DIAGNOSIS — I10 Essential (primary) hypertension: Secondary | ICD-10-CM

## 2011-08-24 MED ORDER — SILDENAFIL CITRATE 100 MG PO TABS: 100.0000 mg | ORAL_TABLET | Freq: Every day | ORAL | Status: DC | PRN

## 2011-08-24 MED ORDER — METOPROLOL SUCCINATE ER 50 MG PO TB24: 50.0000 mg | ORAL_TABLET | Freq: Every day | ORAL | Status: DC

## 2011-08-24 MED ORDER — ATORVASTATIN CALCIUM 80 MG PO TABS: 80.0000 mg | ORAL_TABLET | Freq: Every day | ORAL | Status: DC

## 2011-08-24 NOTE — Patient Instructions (Addendum)
Your physician has recommended you make the following change in your medication: STOP Effient  Your physician recommends that you return for a FASTING LIPID, LIVER, BMP, CBC and TSH in April 2013---nothing to eat or drink after midnight, lab opens at 8:30  Your physician wants you to follow-up in: 6 MONTHS.  You will receive a reminder letter in the mail two months in advance. If you don't receive a letter, please call our office to schedule the follow-up appointment.

## 2011-09-03 NOTE — Progress Notes (Signed)
HPI:  This is a 61 year old gentleman presenting for followup of coronary artery disease. The patient had a non-ST elevation myocardial infarction in 2011. He was found to have two-vessel coronary artery disease and was treated with drug-eluting stents in the LAD and the obtuse marginal branch of the circumflex. The patient has tolerated medical therapy well and he has had no recurrent cardiac events. He denies chest pain or pressure. He has mild dyspnea with activity. He has not been exercising regularly. He denies palpitations or leg swelling. He has no complaints today.  Outpatient Encounter Prescriptions as of 08/24/2011  Medication Sig Dispense Refill  . acetaminophen (TYLENOL) 500 MG tablet One by mouth daily as needed       . aspirin EC 325 MG tablet Take 1 tablet (325 mg total) by mouth daily.  30 tablet  11  . atorvastatin (LIPITOR) 80 MG tablet Take 1 tablet (80 mg total) by mouth daily.  90 tablet  3  . metoprolol succinate (TOPROL-XL) 50 MG 24 hr tablet Take 1 tablet (50 mg total) by mouth daily.  90 tablet  3  . nitroGLYCERIN (NITROSTAT) 0.4 MG SL tablet Place 1 tablet (0.4 mg total) under the tongue every 5 (five) minutes as needed.  25 tablet  6  . Omega-3 Fatty Acids (EQL FISH OIL) 1000 MG CAPS Take 6 capsules by mouth daily.      . sildenafil (VIAGRA) 100 MG tablet Take 1 tablet (100 mg total) by mouth daily as needed.  10 tablet  3  . DISCONTD: atorvastatin (LIPITOR) 80 MG tablet Take 1 tablet (80 mg total) by mouth daily.  30 tablet  6  . DISCONTD: metoprolol (TOPROL-XL) 50 MG 24 hr tablet Take 1 tablet (50 mg total) by mouth daily.  30 tablet  6  . DISCONTD: Omega-3 Fatty Acids (EQL FISH OIL) 1000 MG CAPS Take 6 capsules (6,000 mg total) by mouth 2 (two) times daily.  90 each    . DISCONTD: prasugrel (EFFIENT) 10 MG TABS Take 1 tablet (10 mg total) by mouth daily.  30 tablet  6  . DISCONTD: sildenafil (VIAGRA) 100 MG tablet Take 1 tablet (100 mg total) by mouth daily as needed.  10  tablet  3    No Known Allergies  Past Medical History  Diagnosis Date  . CAD (coronary artery disease)     s/p Non-ST-segment elevation myocardial infarction 05/27/2010; NSTEMI tx'd with Promus DES to LAD and Promus DES to OM2;  residual at cath 05/2010:  OM3 70% (small); D1 80%; CFx 40-50%; EF 55%  . Hyperlipidemia   . Polycythemia     with question of sleep apnea  . Obesity   . GERD (gastroesophageal reflux disease)   . HTN (hypertension)   . MI (myocardial infarction)     ROS: Negative except as per HPI  BP 150/88  Pulse 67  Ht 5\' 10"  (1.778 m)  Wt 109.952 kg (242 lb 6.4 oz)  BMI 34.78 kg/m2  PHYSICAL EXAM: Pt is alert and oriented, obese male in NAD HEENT: normal Neck: JVP - normal, carotids 2+= without bruits Lungs: CTA bilaterally CV: RRR without murmur or gallop Abd: soft, NT, Positive BS, no hepatomegaly Ext: no C/C/E, distal pulses intact and equal Skin: warm/dry no rash  EKG:  Normal sinus rhythm with sinus arrhythmia, 67 beats per minute, within normal limits.  ASSESSMENT AND PLAN:

## 2011-09-03 NOTE — Assessment & Plan Note (Signed)
The patient is stable without anginal symptoms. He is greater than 12 months out from PCI. I have recommended that he discontinue effient at this point. We discussed the risk/benefit of ongoing dual antiplatelet therapy after 12 months and agree that stopping effient is appropriate. He otherwise will continue his current medical program.

## 2011-09-03 NOTE — Assessment & Plan Note (Signed)
Blood pressure is elevated today. He feels it is related to anxiety. I asked him to obtain some readings to confirm that his blood pressure control is OK. He is on a beta blocker.

## 2011-09-03 NOTE — Assessment & Plan Note (Signed)
LDL is at goal. His lipids from last year were reviewed. He is due for followup labs in April, 2013. He is on a statin drug.

## 2011-09-14 ENCOUNTER — Telehealth: Payer: Self-pay | Admitting: Cardiovascular Disease

## 2011-09-14 NOTE — Telephone Encounter (Signed)
New msg Pt wants pre authorization for his med-viagra thru [id number]

## 2011-09-15 NOTE — Telephone Encounter (Addendum)
I called [facility] to try and obtain prior authorization and got the answering service.  The hours of operation are from 8-6.  Will try again to obtain authorization when phone lines are open. I made the pt aware that I am working on this issue. The pt would like to know what quantity he can get at a time and have the max be prescribed.

## 2011-09-17 NOTE — Telephone Encounter (Signed)
I spoke with [facility] and did prior authorization request over the phone.  Our office will receive a notification in the next 24 hours to let us know if Viagra was approved.

## 2011-09-21 NOTE — Telephone Encounter (Signed)
Per fax from [facility] the pt's Viagra has not been denied.  The letter states that current plan approved criteria does not allow coverage of Viagra if the patient requires nitrate therapy on a regular or on an intermittent basis.  I made the pt aware that he can still get this medication from the pharmacy at his own expense.  The pt will check on the price of this medication.

## 2011-10-25 ENCOUNTER — Encounter: Payer: Self-pay | Admitting: Cardiovascular Disease

## 2011-10-25 ENCOUNTER — Other Ambulatory Visit (INDEPENDENT_AMBULATORY_CARE_PROVIDER_SITE_OTHER): Payer: BC Managed Care – PPO

## 2011-10-25 ENCOUNTER — Encounter (INDEPENDENT_AMBULATORY_CARE_PROVIDER_SITE_OTHER): Payer: BC Managed Care – PPO

## 2011-10-25 DIAGNOSIS — R0989 Other specified symptoms and signs involving the circulatory and respiratory systems: Secondary | ICD-10-CM

## 2011-10-25 DIAGNOSIS — E785 Hyperlipidemia, unspecified: Secondary | ICD-10-CM

## 2011-10-25 DIAGNOSIS — I2510 Atherosclerotic heart disease of native coronary artery without angina pectoris: Secondary | ICD-10-CM

## 2011-10-25 LAB — CBC WITH DIFFERENTIAL/PLATELET
Basophils Relative: 0.4 % (ref 0.0–3.0)
Eosinophils Absolute: 0.7 10*3/uL (ref 0.0–0.7)
Eosinophils Relative: 4.4 % (ref 0.0–5.0)
Hemoglobin: 14.8 g/dL (ref 13.0–17.0)
Lymphocytes Relative: 15.7 % (ref 12.0–46.0)
MCHC: 32.8 g/dL (ref 30.0–36.0)
Monocytes Relative: 7.3 % (ref 3.0–12.0)
Neutro Abs: 10.9 10*3/uL — ABNORMAL HIGH (ref 1.4–7.7)
RBC: 4.81 Mil/uL (ref 4.22–5.81)
WBC: 15.1 10*3/uL — ABNORMAL HIGH (ref 4.5–10.5)

## 2011-10-25 LAB — BASIC METABOLIC PANEL
CO2: 30 mEq/L (ref 19–32)
Calcium: 9.1 mg/dL (ref 8.4–10.5)
Sodium: 140 mEq/L (ref 135–145)

## 2011-10-25 LAB — HEPATIC FUNCTION PANEL
ALT: 21 U/L (ref 0–53)
Albumin: 3.8 g/dL (ref 3.5–5.2)
Total Bilirubin: 0.4 mg/dL (ref 0.3–1.2)

## 2011-10-25 LAB — LIPID PANEL
HDL: 28.2 mg/dL — ABNORMAL LOW (ref >39.00)
Total CHOL/HDL Ratio: 4
VLDL: 16.4 mg/dL (ref 0.0–40.0)

## 2011-10-25 LAB — TSH: TSH: 2.08 u[IU]/mL (ref 0.35–5.50)

## 2011-10-28 ENCOUNTER — Other Ambulatory Visit: Payer: Self-pay

## 2011-10-28 DIAGNOSIS — I2510 Atherosclerotic heart disease of native coronary artery without angina pectoris: Secondary | ICD-10-CM

## 2011-10-28 DIAGNOSIS — E785 Hyperlipidemia, unspecified: Secondary | ICD-10-CM

## 2011-10-28 MED ORDER — ATORVASTATIN CALCIUM 20 MG PO TABS: 20.0000 mg | ORAL_TABLET | Freq: Every day | ORAL | Status: DC

## 2012-02-24 ENCOUNTER — Encounter: Payer: Self-pay | Admitting: Cardiovascular Disease

## 2012-03-07 ENCOUNTER — Ambulatory Visit (INDEPENDENT_AMBULATORY_CARE_PROVIDER_SITE_OTHER): Payer: BC Managed Care – PPO | Admitting: Cardiovascular Disease

## 2012-03-07 VITALS — BP 142/80 | HR 65 | Ht 70.0 in | Wt 245.0 lb

## 2012-03-07 DIAGNOSIS — I10 Essential (primary) hypertension: Secondary | ICD-10-CM

## 2012-03-07 DIAGNOSIS — I2510 Atherosclerotic heart disease of native coronary artery without angina pectoris: Secondary | ICD-10-CM

## 2012-03-07 DIAGNOSIS — E785 Hyperlipidemia, unspecified: Secondary | ICD-10-CM

## 2012-03-07 LAB — LIPID PANEL
LDL Cholesterol: 51 mg/dL (ref 0–99)
Total CHOL/HDL Ratio: 3

## 2012-03-07 LAB — HEPATIC FUNCTION PANEL
AST: 26 U/L (ref 0–37)
Albumin: 4.2 g/dL (ref 3.5–5.2)
Alkaline Phosphatase: 87 U/L (ref 39–117)
Bilirubin, Direct: 0 mg/dL (ref 0.0–0.3)
Total Bilirubin: 0.8 mg/dL (ref 0.3–1.2)

## 2012-03-07 NOTE — Patient Instructions (Addendum)
Your physician recommends that you have lab work today: LIPID and LIVER   Your physician wants you to follow-up in: 1 YEAR with Dr Cooper.  You will receive a reminder letter in the mail two months in advance. If you don't receive a letter, please call our office to schedule the follow-up appointment.  Your physician recommends that you continue on your current medications as directed. Please refer to the Current Medication list given to you today.  

## 2012-03-21 ENCOUNTER — Encounter: Payer: Self-pay | Admitting: Cardiovascular Disease

## 2012-03-21 NOTE — Assessment & Plan Note (Signed)
Blood pressure borderline. We'll continue to follow. Discussion regarding low sodium diet, weight loss measures, and exercise

## 2012-03-21 NOTE — Assessment & Plan Note (Signed)
Stable without anginal symptoms. We'll continue the patient's current medical program which includes aspirin, atorvastatin, and metoprolol. The patient is in the Pegasus trial randomizing him to brilinta versus placebo.

## 2012-03-21 NOTE — Assessment & Plan Note (Signed)
Patient treated with a statin drug. LDL is less than 70.

## 2012-03-21 NOTE — Progress Notes (Signed)
   HPI:  61 year old gentleman presenting for followup evaluation. The patient has a history of coronary artery disease and initially presented with a non-ST elevation MI in 2011. He was found to have severe two-vessel coronary artery disease was treated with stenting of the LAD and left circumflex using drug-eluting stent platforms. He discontinue effient when I saw him last in February. He's had no chest pain or dyspnea. He denies palpitations, lightheadedness, or syncope. He is compliant with his medications. He's not been following a diet.  Outpatient Encounter Prescriptions as of 03/07/2012  Medication Sig Dispense Refill  . acetaminophen (TYLENOL) 500 MG tablet One by mouth daily as needed       . aspirin 81 MG tablet Take 81 mg by mouth daily.      Marland Kitchen atorvastatin (LIPITOR) 20 MG tablet Take 1 tablet (20 mg total) by mouth daily.  90 tablet  3  . metoprolol succinate (TOPROL-XL) 50 MG 24 hr tablet Take 1 tablet (50 mg total) by mouth daily.  90 tablet  3  . NON FORMULARY STUDY DRUG      . Omega-3 Fatty Acids (EQL FISH OIL) 1000 MG CAPS Take 6 capsules by mouth daily.      Marland Kitchen DISCONTD: nitroGLYCERIN (NITROSTAT) 0.4 MG SL tablet Place 1 tablet (0.4 mg total) under the tongue every 5 (five) minutes as needed.  25 tablet  6  . sildenafil (VIAGRA) 100 MG tablet Take 1 tablet (100 mg total) by mouth daily as needed.  10 tablet  3  . DISCONTD: aspirin EC 325 MG tablet Take 1 tablet (325 mg total) by mouth daily.  30 tablet  11    No Known Allergies  Past Medical History  Diagnosis Date  . CAD (coronary artery disease)     s/p Non-ST-segment elevation myocardial infarction 05/27/2010; NSTEMI tx'd with Promus DES to LAD and Promus DES to OM2;  residual at cath 05/2010:  OM3 70% (small); D1 80%; CFx 40-50%; EF 55%  . Hyperlipidemia   . Polycythemia     with question of sleep apnea  . Obesity   . GERD (gastroesophageal reflux disease)   . HTN (hypertension)   . MI (myocardial infarction)      ROS: Negative except as per HPI  BP 142/80  Pulse 65  Ht 5\' 10"  (1.778 m)  Wt 245 lb (111.131 kg)  BMI 35.15 kg/m2  PHYSICAL EXAM: Pt is alert and oriented, pleasant obese gentleman in NAD HEENT: normal Neck: JVP - normal, carotids 2+= without bruits Lungs: CTA bilaterally CV: RRR without murmur or gallop Abd: soft, NT, Positive BS, obese Ext: no C/C/E, distal pulses intact and equal Skin: warm/dry no rash  EKG:  Normal sinus rhythm 65 beats per minute, within normal limits the  ASSESSMENT AND PLAN:

## 2012-07-11 ENCOUNTER — Encounter: Payer: Self-pay | Admitting: Cardiovascular Disease

## 2012-07-11 ENCOUNTER — Ambulatory Visit (INDEPENDENT_AMBULATORY_CARE_PROVIDER_SITE_OTHER): Payer: BC Managed Care – PPO | Admitting: Cardiovascular Disease

## 2012-07-11 VITALS — BP 134/78 | HR 77 | Ht 70.0 in | Wt 235.8 lb

## 2012-07-11 DIAGNOSIS — E7800 Pure hypercholesterolemia, unspecified: Secondary | ICD-10-CM

## 2012-07-11 DIAGNOSIS — I2510 Atherosclerotic heart disease of native coronary artery without angina pectoris: Secondary | ICD-10-CM

## 2012-07-11 NOTE — Patient Instructions (Addendum)
Your physician wants you to follow-up in: 1 year with Dr Excell Seltzer. You will receive a reminder letter in the mail two months in advance. If you don't receive a letter, please call our office to schedule the follow-up appointment.  Your physician recommends that you return for lab work in: August (lipid and liver profile)

## 2012-07-11 NOTE — Progress Notes (Signed)
   HPI:  60 year old gentleman presenting for followup of coronary artery disease. The patient presented with a non-STEMI in 2011. He underwent PCI of the LAD and left circumflex vessels at that time he was treated with drug-eluting stents. Lipids from August showed a cholesterol of 124, triglycerides 188, HDL 36, and LDL 51.  The patient is having no problems at present. He denies chest pain, dyspnea, or edema. Has not engaged in any regular exercise. He is also not interested in starting an exercise. He is having some problems swallowing his fish oil pills.  Outpatient Encounter Prescriptions as of 07/11/2012  Medication Sig Dispense Refill  . acetaminophen (TYLENOL) 500 MG tablet One by mouth daily as needed       . aspirin 81 MG tablet Take 81 mg by mouth daily.      Marland Kitchen atorvastatin (LIPITOR) 20 MG tablet Take 1 tablet (20 mg total) by mouth daily.  90 tablet  3  . metoprolol succinate (TOPROL-XL) 50 MG 24 hr tablet Take 1 tablet (50 mg total) by mouth daily.  90 tablet  3  . NON FORMULARY STUDY DRUG      . Omega-3 Fatty Acids (EQL FISH OIL) 1000 MG CAPS Take 6 capsules by mouth daily.      . sildenafil (VIAGRA) 100 MG tablet Take 1 tablet (100 mg total) by mouth daily as needed.  10 tablet  3    No Known Allergies  Past Medical History  Diagnosis Date  . CAD (coronary artery disease)     s/p Non-ST-segment elevation myocardial infarction 05/27/2010; NSTEMI tx'd with Promus DES to LAD and Promus DES to OM2;  residual at cath 05/2010:  OM3 70% (small); D1 80%; CFx 40-50%; EF 55%  . Hyperlipidemia   . Polycythemia     with question of sleep apnea  . Obesity   . GERD (gastroesophageal reflux disease)   . HTN (hypertension)   . MI (myocardial infarction)     ROS: Negative except as per HPI  BP 134/78  Pulse 77  Ht 5\' 10"  (1.778 m)  Wt 106.958 kg (235 lb 12.8 oz)  BMI 33.83 kg/m2  SpO2 96%  PHYSICAL EXAM: Pt is alert and oriented, overweight male in NAD HEENT: normal Neck: JVP  - normal, carotids 2+= without bruits Lungs: CTA bilaterally CV: RRR without murmur or gallop Abd: soft, NT, Positive BS, no hepatomegaly Ext: no C/C/E, distal pulses intact and equal Skin: warm/dry no rash  ASSESSMENT AND PLAN: 1. Coronary artery disease, native vessel. The patient is stable without anginal symptoms. He is on appropriate medical program with aspirin, Lipitor, and long-acting metoprolol. He will followup in one year. He participates in the Pegasus study (ticagrelor versus placebo).  2. Hyperlipidemia. Lipids were reviewed and he should remain on atorvastatin 20 mg daily. He will be due for a followup lipid panel in August 2014. He is going to stop taking fish oil.  3. Essential hypertension. Blood pressure is controlled on metoprolol succinate 50 mg daily. He understands the need to initiate an exercise program and focus on weight loss, but it is clear he is not going to do this.  4. erectile dysfunction. Will refill his Viagra.  Tonny Bollman 07/11/2012 1:51 PM

## 2012-09-09 ENCOUNTER — Other Ambulatory Visit: Payer: Self-pay

## 2012-10-20 ENCOUNTER — Other Ambulatory Visit: Payer: Self-pay | Admitting: Cardiovascular Disease

## 2013-05-24 ENCOUNTER — Encounter: Payer: Self-pay | Admitting: Cardiovascular Disease

## 2013-05-31 ENCOUNTER — Other Ambulatory Visit: Payer: Self-pay

## 2013-10-20 ENCOUNTER — Other Ambulatory Visit: Payer: Self-pay | Admitting: Cardiovascular Disease

## 2013-10-25 ENCOUNTER — Other Ambulatory Visit: Payer: Self-pay | Admitting: *Deleted

## 2013-10-25 MED ORDER — ATORVASTATIN CALCIUM 20 MG PO TABS: 20.0000 mg | ORAL_TABLET | Freq: Every day | ORAL | Status: DC

## 2013-10-25 MED ORDER — METOPROLOL SUCCINATE ER 50 MG PO TB24: 50.0000 mg | ORAL_TABLET | Freq: Every day | ORAL | Status: DC

## 2013-10-25 NOTE — Telephone Encounter (Signed)
Patient called for refills but hasn't been seen in 2 years, made appointment for 11/05/2013, medication will be filled for 1 month.

## 2013-11-05 ENCOUNTER — Encounter: Payer: Self-pay | Admitting: Nurse Practitioner

## 2013-11-05 ENCOUNTER — Ambulatory Visit (INDEPENDENT_AMBULATORY_CARE_PROVIDER_SITE_OTHER): Payer: BC Managed Care – PPO | Admitting: Nurse Practitioner

## 2013-11-05 VITALS — BP 120/70 | HR 72 | Ht 70.0 in | Wt 220.8 lb

## 2013-11-05 DIAGNOSIS — E7800 Pure hypercholesterolemia, unspecified: Secondary | ICD-10-CM

## 2013-11-05 DIAGNOSIS — I10 Essential (primary) hypertension: Secondary | ICD-10-CM

## 2013-11-05 DIAGNOSIS — I214 Non-ST elevation (NSTEMI) myocardial infarction: Secondary | ICD-10-CM

## 2013-11-05 DIAGNOSIS — I259 Chronic ischemic heart disease, unspecified: Secondary | ICD-10-CM

## 2013-11-05 MED ORDER — METOPROLOL SUCCINATE ER 50 MG PO TB24: 50.0000 mg | ORAL_TABLET | Freq: Every day | ORAL | Status: DC

## 2013-11-05 MED ORDER — ATORVASTATIN CALCIUM 20 MG PO TABS: 20.0000 mg | ORAL_TABLET | Freq: Every day | ORAL | Status: DC

## 2013-11-05 NOTE — Progress Notes (Addendum)
Berneta Levins Date of Birth: [birth year] Medical Record #[id number]  History of Present Illness: Mr. Mahone is seen back today for a follow up visit. Seen for Dr. Burt Knack. He is a 63 year old male with CAD. He had a NSTEMI in 2011 and had PCI of the LAD and LCX with DES. Other issues include HLD, HTN, ED, NIDDM,  obesity, GERD and polycythemia.  He has not been here since December of 2013. Was doing well. He had no inclination to exercise noted.  Comes back today. Here with his wife. Needs medicines refilled. Doing ok. No chest pain - but never had with his MI - had indigestion. Some residual disease noted on his cath. He is not exercising. Tries to be active with working in the yard, washing to car, etc. Not short of breath. Notes more fatigue. Arms feel "heavy" at times - wife thinks it is more related to when he tries to do stuff. Not fasting today. Has been placed on Metformin since his last visit here.   Current Outpatient Prescriptions  Medication Sig Dispense Refill  . acetaminophen (TYLENOL) 500 MG tablet One by mouth daily as needed       . aspirin 81 MG tablet Take 81 mg by mouth daily.      Marland Kitchen atorvastatin (LIPITOR) 20 MG tablet Take 1 tablet (20 mg total) by mouth daily at 6 PM.  30 tablet  0  . metFORMIN (GLUCOPHAGE) 500 MG tablet Take 500 mg by mouth 2 (two) times daily with a meal.       . metoprolol succinate (TOPROL-XL) 50 MG 24 hr tablet Take 1 tablet (50 mg total) by mouth daily.  30 tablet  0   No current facility-administered medications for this visit.    No Known Allergies  Past Medical History  Diagnosis Date  . CAD (coronary artery disease)     s/p Non-ST-segment elevation myocardial infarction 05/27/2010; NSTEMI tx'd with Promus DES to LAD and Promus DES to OM2;  residual at cath 05/2010:  OM3 70% (small); D1 80%; CFx 40-50%; EF 55%  . Hyperlipidemia   . Polycythemia     with question of sleep apnea  . Obesity   . GERD (gastroesophageal reflux disease)     . HTN (hypertension)   . MI (myocardial infarction)     Past Surgical History  Procedure Laterality Date  . Back surgery      x2  . Shoulder surgery  2007  . Stents placed      x2    History  Smoking status  . Former Smoker -- 1.00 packs/day for 35 years  . Types: Cigarettes  . Quit date: 05/26/2010  Smokeless tobacco  . Not on file    Comment: smoked 0.5-1 ppd; started in 1970s and quit 05/26/2010    History  Alcohol Use  . Yes    Comment: rarely    Family History  Problem Relation Age of Onset  . Pneumonia Father   . Heart attack Sister   . Breast cancer Mother     Review of Systems: The review of systems is per the HPI.  All other systems were reviewed and are negative.  Physical Exam: BP 120/70  Pulse 72  Ht 5\' 10"  (1.778 m)  Wt 220 lb 12.8 oz (100.154 kg)  BMI 31.68 kg/m2 Patient is very pleasant and in no acute distress. He is obese but weight is down 15 pounds. Skin is warm and dry. Color is normal.  HEENT  is unremarkable. Normocephalic/atraumatic. PERRL. Sclera are nonicteric. Neck is supple. No masses. No JVD. Lungs are clear. Cardiac exam shows a regular rate and rhythm. Abdomen is soft. Extremities are without edema. Gait and ROM are intact. No gross neurologic deficits noted.  Wt Readings from Last 3 Encounters:  11/05/13 220 lb 12.8 oz (100.154 kg)  07/11/12 235 lb 12.8 oz (106.958 kg)  03/07/12 245 lb (111.131 kg)     LABORATORY DATA: EKG pending  Lab Results  Component Value Date   WBC 15.1* 10/25/2011   HGB 14.8 10/25/2011   HCT 45.2 10/25/2011   PLT 274.0 10/25/2011   GLUCOSE 158* 10/25/2011   CHOL 124 03/07/2012   TRIG 188.0* 03/07/2012   HDL 35.80* 03/07/2012   LDLDIRECT 53.2 11/20/2010   LDLCALC 51 03/07/2012   ALT 31 03/07/2012   AST 26 03/07/2012   NA 140 10/25/2011   K 4.1 10/25/2011   CL 102 10/25/2011   CREATININE 1.0 10/25/2011   BUN 12 10/25/2011   CO2 30 10/25/2011   TSH 2.08 10/25/2011   INR 0.99 05/26/2010   HGBA1C 6.4 11/20/2010     Assessment / Plan: 1. CAD with prior NSTEMI with DES to the LAD and LCX back in 2011 - no active chest pain but notes more "heaviness in his arms" and less energy. Had residual disease on cath in 2011 - will arrange for stress Myoview.   2. HTN - BP is good. No change with current regimen.   3. HLD - on statin - not fasting today. Will need repeat labs the day of his Myoview.  4. NIDDM - now on treatement  Tentatively see back in a year. CV risk factor modification encouraged.    Patient is agreeable to this plan and will call if any problems develop in the interim.   Burtis Junes, RN, [place] [address] [place], [place]  [phone]

## 2013-11-05 NOTE — Patient Instructions (Addendum)
We need to check labs today  I have refilled your medicine today  We will arrange for a stress test (stress Myoview)  See Dr. Burt Knack in a year  Call the [place] office at [phone] if you have any questions, problems or concerns.

## 2013-11-07 ENCOUNTER — Other Ambulatory Visit: Payer: Self-pay | Admitting: *Deleted

## 2013-11-07 MED ORDER — METOPROLOL SUCCINATE ER 50 MG PO TB24: 50.0000 mg | ORAL_TABLET | Freq: Every day | ORAL | Status: DC

## 2013-11-07 MED ORDER — ATORVASTATIN CALCIUM 20 MG PO TABS: 20.0000 mg | ORAL_TABLET | Freq: Every day | ORAL | Status: DC

## 2013-11-21 ENCOUNTER — Other Ambulatory Visit (INDEPENDENT_AMBULATORY_CARE_PROVIDER_SITE_OTHER): Payer: BC Managed Care – PPO

## 2013-11-21 ENCOUNTER — Ambulatory Visit (HOSPITAL_COMMUNITY): Payer: BC Managed Care – PPO | Attending: Internal Medicine | Admitting: Radiology

## 2013-11-21 ENCOUNTER — Encounter: Payer: Self-pay | Admitting: Internal Medicine

## 2013-11-21 VITALS — BP 147/65 | HR 60 | Ht 70.0 in | Wt 222.0 lb

## 2013-11-21 DIAGNOSIS — I259 Chronic ischemic heart disease, unspecified: Secondary | ICD-10-CM

## 2013-11-21 DIAGNOSIS — I214 Non-ST elevation (NSTEMI) myocardial infarction: Secondary | ICD-10-CM

## 2013-11-21 DIAGNOSIS — I2510 Atherosclerotic heart disease of native coronary artery without angina pectoris: Secondary | ICD-10-CM

## 2013-11-21 LAB — HEPATIC FUNCTION PANEL
ALT: 13 U/L (ref 0–53)
AST: 17 U/L (ref 0–37)
Albumin: 4.1 g/dL (ref 3.5–5.2)
Alkaline Phosphatase: 75 U/L (ref 39–117)
Bilirubin, Direct: 0.1 mg/dL (ref 0.0–0.3)
Total Bilirubin: 0.5 mg/dL (ref 0.3–1.2)
Total Protein: 7.3 g/dL (ref 6.0–8.3)

## 2013-11-21 LAB — LIPID PANEL
Cholesterol: 117 mg/dL (ref 0–200)
HDL: 25.9 mg/dL — ABNORMAL LOW (ref >39.00)
LDL Cholesterol: 53 mg/dL (ref 0–99)
Total CHOL/HDL Ratio: 5
Triglycerides: 191 mg/dL — ABNORMAL HIGH (ref 0.0–149.0)
VLDL: 38.2 mg/dL (ref 0.0–40.0)

## 2013-11-21 LAB — BASIC METABOLIC PANEL
BUN: 18 mg/dL (ref 6–23)
CO2: 25 mEq/L (ref 19–32)
Calcium: 9.3 mg/dL (ref 8.4–10.5)
Chloride: 101 mEq/L (ref 96–112)
Creatinine, Ser: 1.4 mg/dL (ref 0.4–1.5)
GFR: 53.14 mL/min — ABNORMAL LOW (ref >60.00)
Glucose, Bld: 109 mg/dL — ABNORMAL HIGH (ref 70–99)
Potassium: 4.4 mEq/L (ref 3.5–5.1)
Sodium: 134 mEq/L — ABNORMAL LOW (ref 135–145)

## 2013-11-21 MED ORDER — TECHNETIUM TC 99M SESTAMIBI GENERIC - CARDIOLITE
30.0000 | Freq: Once | INTRAVENOUS | Status: AC | PRN
  Administered 2013-11-21: 30 via INTRAVENOUS

## 2013-11-21 MED ORDER — TECHNETIUM TC 99M SESTAMIBI GENERIC - CARDIOLITE
10.0000 | Freq: Once | INTRAVENOUS | Status: AC | PRN
  Administered 2013-11-21: 10 via INTRAVENOUS

## 2013-11-21 MED ORDER — REGADENOSON 0.4 MG/5ML IV SOLN
0.4000 mg | Freq: Once | INTRAVENOUS | Status: AC
  Administered 2013-11-21: 0.4 mg via INTRAVENOUS

## 2013-11-21 NOTE — Progress Notes (Signed)
Jose Burns NUCLE[facility] [place], [place] [address] [phone]    Cardiology Nuclear Med Study  Jose Burns is a 62 y.o. male     MRN : [id number]     DOB: 1950-08-25  Procedure Date: 11/21/2013  Nuclear Med Background Indication for Stress Test:  Evaluation for Ischemia and Stent Patency History:  CAD, MI, Cath, Stent, PTCA, OSA, COPD Cardiac Risk Factors: History of Smoking, Hypertension, Lipids and NIDDM  Symptoms:  Fatigue and arms feel "heavy" at times   Nuclear Pre-Procedure Caffeine/Decaff Intake:  8:00pm NPO After: 8:00pm   Lungs:  clear O2 Sat: 96% on room air. IV 0.9% NS with Angio Cath:  22g  IV Site: R Wrist, tolerated well IV Started by:  Irven Baltimore, RN  Chest Size (in):  44 Cup Size: n/a  Height: 5\' 10"  (1.778 m)  Weight:  222 lb (100.699 kg)  BMI:  Body mass index is 31.85 kg/(m^2). Tech Comments:  Held Toprol x 24 hrs. No medication (Metformin) this am. Irven Baltimore, RN    Nuclear Med Study 1 or 2 day study: 1 day  Stress Test Type:  Treadmill/Lexiscan  Reading MD: N/A  Order Authorizing Provider:  Sherren Mocha, MD  Resting Radionuclide: Technetium 67m Sestamibi  Resting Radionuclide Dose: 11.0 mCi   Stress Radionuclide:  Technetium 29m Sestamibi  Stress Radionuclide Dose: 33.0 mCi           Stress Protocol Rest HR: 60 Stress HR: 127  Rest BP: 147/65 Stress BP: 177/60  Exercise Time (min): n/a METS: n/a           Dose of Adenosine (mg):  n/a Dose of Lexiscan: 0.4 mg  Dose of Atropine (mg): n/a Dose of Dobutamine: n/a mcg/kg/min (at max HR)  Stress Test Technologist: Glade Lloyd, BS-ES  Nuclear Technologist:  Charlton Amor, CNMT     Rest Procedure:  Myocardial perfusion imaging was performed at rest 45 minutes following the intravenous administration of Technetium 20m Sestamibi. Rest ECG: NSR - Normal EKG  Stress Procedure:  The patient received IV Lexiscan 0.4 mg over 15-seconds with concurrent low  level exercise and then Technetium 52m Sestamibi was injected at 30-seconds while the patient continued walking one more minute.  Quantitative spect images were obtained after a 45-minute delay.  Attempted to walk patient on Bruce Protocol.   He became very fatigued and could not continue.  Switched to [place].  During the infusion he experience a cough and SOB.  This resolved in recovery.  Stress ECG: No significant change from baseline ECG Unable to complete exercise protocol - changed to Lexiscan  QPS Raw Data Images:  Normal; no motion artifact; normal heart/lung ratio. Stress Images:  Normal homogeneous uptake in all areas of the myocardium. Rest Images:  Normal homogeneous uptake in all areas of the myocardium. Subtraction (SDS):  No evidence of ischemia. Transient Ischemic Dilatation (Normal <1.22):  0.94 Lung/Heart Ratio (Normal <0.45):  0.26  Quantitative Gated Spect Images QGS EDV:  99 ml QGS ESV:  38 ml  Impression Exercise Capacity:  Lexiscan with low level exercise. BP Response:  Normal blood pressure response. Clinical Symptoms:  No significant symptoms noted. ECG Impression:  No significant ST segment change suggestive of ischemia. Comparison with Prior Nuclear Study: No images to compare  Overall Impression:  Normal stress nuclear study.  LV Ejection Fraction: 58%.  LV Wall Motion:  NL LV Function; NL Wall Motion  Sanda Klein, MD, [facility] [phone] office [phone] pager   .

## 2013-11-23 ENCOUNTER — Telehealth: Payer: Self-pay | Admitting: Cardiovascular Disease

## 2013-11-23 NOTE — Telephone Encounter (Signed)
Pt is aware of stress test results Debbie Mana Morison RN  

## 2013-11-23 NOTE — Telephone Encounter (Signed)
New message ° ° ° ° ° °Calling to get test results °

## 2014-01-23 ENCOUNTER — Emergency Department (HOSPITAL_COMMUNITY): Payer: BC Managed Care – PPO

## 2014-01-23 ENCOUNTER — Observation Stay (HOSPITAL_COMMUNITY)
Admission: EM | Admit: 2014-01-23 | Discharge: 2014-01-24 | DRG: 378 | Disposition: A | Discharge status: Home / Self Care | Payer: BC Managed Care – PPO | Attending: Internal Medicine | Admitting: Internal Medicine

## 2014-01-23 ENCOUNTER — Encounter (HOSPITAL_COMMUNITY): Payer: Self-pay | Admitting: Emergency Medicine

## 2014-01-23 DIAGNOSIS — Z7982 Long term (current) use of aspirin: Secondary | ICD-10-CM

## 2014-01-23 DIAGNOSIS — I10 Essential (primary) hypertension: Secondary | ICD-10-CM

## 2014-01-23 DIAGNOSIS — G4733 Obstructive sleep apnea (adult) (pediatric): Secondary | ICD-10-CM | POA: Diagnosis present

## 2014-01-23 DIAGNOSIS — E119 Type 2 diabetes mellitus without complications: Secondary | ICD-10-CM | POA: Diagnosis present

## 2014-01-23 DIAGNOSIS — E785 Hyperlipidemia, unspecified: Secondary | ICD-10-CM | POA: Diagnosis present

## 2014-01-23 DIAGNOSIS — K219 Gastro-esophageal reflux disease without esophagitis: Secondary | ICD-10-CM | POA: Diagnosis present

## 2014-01-23 DIAGNOSIS — K5730 Diverticulosis of large intestine without perforation or abscess without bleeding: Secondary | ICD-10-CM | POA: Diagnosis present

## 2014-01-23 DIAGNOSIS — E872 Acidosis, unspecified: Secondary | ICD-10-CM | POA: Diagnosis present

## 2014-01-23 DIAGNOSIS — Z87891 Personal history of nicotine dependence: Secondary | ICD-10-CM

## 2014-01-23 DIAGNOSIS — N200 Calculus of kidney: Secondary | ICD-10-CM | POA: Diagnosis present

## 2014-01-23 DIAGNOSIS — K5731 Diverticulosis of large intestine without perforation or abscess with bleeding: Secondary | ICD-10-CM

## 2014-01-23 DIAGNOSIS — Z803 Family history of malignant neoplasm of breast: Secondary | ICD-10-CM

## 2014-01-23 DIAGNOSIS — K625 Hemorrhage of anus and rectum: Principal | ICD-10-CM | POA: Diagnosis present

## 2014-01-23 DIAGNOSIS — J449 Chronic obstructive pulmonary disease, unspecified: Secondary | ICD-10-CM | POA: Diagnosis present

## 2014-01-23 DIAGNOSIS — J4489 Other specified chronic obstructive pulmonary disease: Secondary | ICD-10-CM | POA: Diagnosis present

## 2014-01-23 DIAGNOSIS — I2510 Atherosclerotic heart disease of native coronary artery without angina pectoris: Secondary | ICD-10-CM | POA: Diagnosis present

## 2014-01-23 DIAGNOSIS — D126 Benign neoplasm of colon, unspecified: Secondary | ICD-10-CM | POA: Diagnosis present

## 2014-01-23 DIAGNOSIS — D751 Secondary polycythemia: Secondary | ICD-10-CM

## 2014-01-23 DIAGNOSIS — I252 Old myocardial infarction: Secondary | ICD-10-CM

## 2014-01-23 DIAGNOSIS — D45 Polycythemia vera: Secondary | ICD-10-CM

## 2014-01-23 DIAGNOSIS — K8020 Calculus of gallbladder without cholecystitis without obstruction: Secondary | ICD-10-CM | POA: Diagnosis present

## 2014-01-23 DIAGNOSIS — Z8249 Family history of ischemic heart disease and other diseases of the circulatory system: Secondary | ICD-10-CM

## 2014-01-23 HISTORY — DX: Diverticulosis of intestine, part unspecified, without perforation or abscess without bleeding: K57.90

## 2014-01-23 HISTORY — DX: Type 2 diabetes mellitus without complications: E11.9

## 2014-01-23 HISTORY — DX: Hemorrhage of anus and rectum: K62.5

## 2014-01-23 HISTORY — DX: Chronic obstructive pulmonary disease, unspecified: J44.9

## 2014-01-23 LAB — CBC WITH DIFFERENTIAL/PLATELET
BASOS ABS: 0.1 10*3/uL (ref 0.0–0.1)
Basophils Relative: 1 % (ref 0–1)
EOS PCT: 3 % (ref 0–5)
Eosinophils Absolute: 0.5 10*3/uL (ref 0.0–0.7)
HEMATOCRIT: 39.8 % (ref 39.0–52.0)
HEMOGLOBIN: 13 g/dL (ref 13.0–17.0)
LYMPHS PCT: 8 % — AB (ref 12–46)
Lymphs Abs: 1.4 10*3/uL (ref 0.7–4.0)
MCH: 30.2 pg (ref 26.0–34.0)
MCHC: 32.7 g/dL (ref 30.0–36.0)
MCV: 92.3 fL (ref 78.0–100.0)
MONO ABS: 1.1 10*3/uL — AB (ref 0.1–1.0)
MONOS PCT: 7 % (ref 3–12)
NEUTROS ABS: 14.2 10*3/uL — AB (ref 1.7–7.7)
Neutrophils Relative %: 81 % — ABNORMAL HIGH (ref 43–77)
Platelets: 291 10*3/uL (ref 150–400)
RBC: 4.31 MIL/uL (ref 4.22–5.81)
RDW: 13.6 % (ref 11.5–15.5)
WBC: 17.2 10*3/uL — ABNORMAL HIGH (ref 4.0–10.5)

## 2014-01-23 LAB — GLUCOSE, CAPILLARY
Glucose-Capillary: 122 mg/dL — ABNORMAL HIGH (ref 70–99)
Glucose-Capillary: 104 mg/dL — ABNORMAL HIGH (ref 70–99)
Glucose-Capillary: 99 mg/dL (ref 70–99)

## 2014-01-23 LAB — BASIC METABOLIC PANEL
BUN: 23 mg/dL (ref 6–23)
CHLORIDE: 98 meq/L (ref 96–112)
CO2: 26 meq/L (ref 19–32)
CREATININE: 1.23 mg/dL (ref 0.50–1.35)
Calcium: 9.3 mg/dL (ref 8.4–10.5)
GFR calc Af Amer: 71 mL/min — ABNORMAL LOW (ref >90)
GFR calc non Af Amer: 61 mL/min — ABNORMAL LOW (ref >90)
Glucose, Bld: 171 mg/dL — ABNORMAL HIGH (ref 70–99)
Potassium: 4.7 mEq/L (ref 3.7–5.3)
Sodium: 138 mEq/L (ref 137–147)

## 2014-01-23 LAB — CBC
HCT: 35 % — ABNORMAL LOW (ref 39.0–52.0)
HEMATOCRIT: 33.6 % — AB (ref 39.0–52.0)
Hemoglobin: 11.6 g/dL — ABNORMAL LOW (ref 13.0–17.0)
Hemoglobin: 11.1 g/dL — ABNORMAL LOW (ref 13.0–17.0)
MCH: 30.5 pg (ref 26.0–34.0)
MCH: 30.3 pg (ref 26.0–34.0)
MCHC: 33.1 g/dL (ref 30.0–36.0)
MCHC: 33 g/dL (ref 30.0–36.0)
MCV: 92.1 fL (ref 78.0–100.0)
MCV: 91.8 fL (ref 78.0–100.0)
PLATELETS: 229 10*3/uL (ref 150–400)
Platelets: 253 10*3/uL (ref 150–400)
RBC: 3.8 MIL/uL — AB (ref 4.22–5.81)
RBC: 3.66 MIL/uL — AB (ref 4.22–5.81)
RDW: 13.8 % (ref 11.5–15.5)
RDW: 13.8 % (ref 11.5–15.5)
WBC: 11.4 10*3/uL — ABNORMAL HIGH (ref 4.0–10.5)
WBC: 10.7 10*3/uL — AB (ref 4.0–10.5)

## 2014-01-23 LAB — POC OCCULT BLOOD, ED: Fecal Occult Bld: POSITIVE — AB

## 2014-01-23 LAB — HEMOGLOBIN A1C
HEMOGLOBIN A1C: 6.2 % — AB (ref <5.7)
MEAN PLASMA GLUCOSE: 131 mg/dL — AB (ref <117)

## 2014-01-23 LAB — LACTIC ACID, PLASMA: Lactic Acid, Venous: 2.8 mmol/L — ABNORMAL HIGH (ref 0.5–2.2)

## 2014-01-23 MED ORDER — ACETAMINOPHEN 650 MG RE SUPP: 650.0000 mg | Freq: Four times a day (QID) | RECTAL | Status: DC | PRN

## 2014-01-23 MED ORDER — ONDANSETRON HCL 4 MG/2ML IJ SOLN: 4.0000 mg | Freq: Four times a day (QID) | INTRAMUSCULAR | Status: DC | PRN

## 2014-01-23 MED ORDER — INSULIN ASPART 100 UNIT/ML ~~LOC~~ SOLN: 0.0000 [IU] | Freq: Every day | SUBCUTANEOUS | Status: DC

## 2014-01-23 MED ORDER — SODIUM CHLORIDE 0.9 % IV SOLN
INTRAVENOUS | Status: DC
  Administered 2014-01-24: 11:00:00 via INTRAVENOUS

## 2014-01-23 MED ORDER — ATORVASTATIN CALCIUM 20 MG PO TABS
20.0000 mg | ORAL_TABLET | Freq: Every day | ORAL | Status: DC
  Administered 2014-01-23: 20 mg via ORAL
  Filled 2014-01-23: qty 1

## 2014-01-23 MED ORDER — SODIUM CHLORIDE 0.9 % IV SOLN
INTRAVENOUS | Status: DC
  Administered 2014-01-23 – 2014-01-24 (×2): via INTRAVENOUS

## 2014-01-23 MED ORDER — METOPROLOL SUCCINATE ER 50 MG PO TB24
50.0000 mg | ORAL_TABLET | Freq: Every day | ORAL | Status: DC
  Administered 2014-01-23 – 2014-01-24 (×2): 50 mg via ORAL
  Filled 2014-01-23 (×2): qty 1

## 2014-01-23 MED ORDER — ACETAMINOPHEN 325 MG PO TABS: 650.0000 mg | ORAL_TABLET | Freq: Four times a day (QID) | ORAL | Status: DC | PRN

## 2014-01-23 MED ORDER — ONDANSETRON HCL 4 MG PO TABS: 4.0000 mg | ORAL_TABLET | Freq: Four times a day (QID) | ORAL | Status: DC | PRN

## 2014-01-23 MED ORDER — IOHEXOL 350 MG/ML SOLN
100.0000 mL | Freq: Once | INTRAVENOUS | Status: AC | PRN
  Administered 2014-01-23: 100 mL via INTRAVENOUS

## 2014-01-23 MED ORDER — SODIUM CHLORIDE 0.9 % IV BOLUS (SEPSIS)
1000.0000 mL | Freq: Once | INTRAVENOUS | Status: AC
  Administered 2014-01-23: 1000 mL via INTRAVENOUS

## 2014-01-23 MED ORDER — PANTOPRAZOLE SODIUM 40 MG IV SOLR
40.0000 mg | Freq: Every day | INTRAVENOUS | Status: DC
  Administered 2014-01-23 – 2014-01-24 (×2): 40 mg via INTRAVENOUS
  Filled 2014-01-23 (×2): qty 40

## 2014-01-23 MED ORDER — PEG 3350-KCL-NA BICARB-NACL 420 G PO SOLR
4000.0000 mL | Freq: Once | ORAL | Status: AC
  Administered 2014-01-24: 4000 mL via ORAL
  Filled 2014-01-23: qty 4000

## 2014-01-23 MED ORDER — INSULIN ASPART 100 UNIT/ML ~~LOC~~ SOLN: 0.0000 [IU] | Freq: Three times a day (TID) | SUBCUTANEOUS | Status: DC

## 2014-01-23 MED ORDER — HYDROCODONE-ACETAMINOPHEN 5-325 MG PO TABS: 1.0000 | ORAL_TABLET | ORAL | Status: DC | PRN

## 2014-01-23 MED ORDER — ZOLPIDEM TARTRATE 5 MG PO TABS: 5.0000 mg | ORAL_TABLET | Freq: Every evening | ORAL | Status: DC | PRN

## 2014-01-23 NOTE — H&P (Signed)
[facility] History and Physical  [name] HTD:[id number] DOB: February 07, 1951 DOA: 01/23/2014  Referring physician:  PCP: Lonzo Cloud, MD   Chief Complaint: diverticulosis  HPI: Jose Burns is a 63 y.o. male with a past medical history that includes diabetes, CAD, hypertension, presents to the emergency department with the chief complaint of rectal bleeding. Initial evaluation reveals colonic diverticulosis.  Patient reports that for the last 3 or 4 days he's experienced intermittent soft maroon stools with clots. He reports the first one or 2 days there was a "significant amount" of maroon blood in his stools but he does not reports frequent bowel movements. He reports for the last 2 days the bleeding seemed to have resolved and then early this morning he had a bowel movement that was mostly maroon blood with clots. He denies dark stools or diarrhea. He denies abdominal pain nausea vomiting. He denies chest pain palpitation shortness of breath dizziness syncope or near-syncope. He takes one baby aspirin daily and reports no other NSAID use. He has never had a colonoscopy before.   Workup in the emergency department includes a basic metabolic panel significant for serum glucose of 171, complete blood count significant for a white count of 17.2. FOBT positive. CT of abdomen pelvis yields colonic diverticulosis. No other identifiable cause of GI bleeding. Bilateral nephrolithiasis, including a 14 mm stone the left. Cholelithiasis.  The time of my exam he is hemodynamically stable afebrile nontoxic appearing. He is not hypoxic.  Review of Systems:  Point review of systems completed and all systems are negative except as indicated in the history of present illness   Past Medical History  Diagnosis Date  . CAD (coronary artery disease)     s/p Non-ST-segment elevation myocardial infarction 05/27/2010; NSTEMI tx'd with Promus DES to LAD and Promus DES to OM2;  residual at cath 05/2010:   OM3 70% (small); D1 80%; CFx 40-50%; EF 55%  . Hyperlipidemia   . Polycythemia     with question of sleep apnea  . Obesity   . GERD (gastroesophageal reflux disease)   . HTN (hypertension)   . MI (myocardial infarction)   . Diabetes mellitus without complication   . COPD (chronic obstructive pulmonary disease)   . Diverticulosis   . Rectal bleeding     7/15   Past Surgical History  Procedure Laterality Date  . Back surgery      x2  . Shoulder surgery  2007  . Stents placed      x2   Social History:  reports that he quit smoking about 3 years ago. His smoking use included Cigarettes. He has a 35 pack-year smoking history. He does not have any smokeless tobacco history on file. He reports that he drinks alcohol. He reports that he does not use illicit drugs. He is married he lives with his wife has done so for the last 20 years. 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 Pneumonia Father   . Heart attack Sister   . Breast cancer Mother      Prior to Admission medications   Medication Sig Start Date End Date Taking? Authorizing Provider  acetaminophen (TYLENOL) 500 MG tablet Take 1,000 mg by mouth every 6 (six) hours as needed for mild pain. One by mouth daily as needed   Yes Historical Provider, MD  aspirin 81 MG tablet Take 81 mg by mouth daily.  Yes Historical Provider, MD  atorvastatin (LIPITOR) 20 MG tablet Take 1 tablet (20 mg total) by mouth daily at 6 PM. 11/07/13  Yes Sherren Mocha, MD  metFORMIN (GLUCOPHAGE) 500 MG tablet Take 500 mg by mouth 2 (two) times daily with a meal.  08/27/13  Yes Historical Provider, MD  metoprolol succinate (TOPROL-XL) 50 MG 24 hr tablet Take 1 tablet (50 mg total) by mouth daily. 11/07/13  Yes Sherren Mocha, MD   Physical Exam: Filed Vitals:   01/23/14 0712  BP: 117/65  Pulse: 75  Temp:   Resp: 16    BP 117/65  Pulse 75  Temp(Src)  98.1 F (36.7 C) (Oral)  Resp 16  Ht 5\' 10"  (1.778 m)  Wt 99.338 kg (219 lb)  BMI 31.42 kg/m2  SpO2 98%  General:  Appears calm and comfortable Eyes: PERRL, normal lids, irises & conjunctiva ENT: grossly normal hearing, lips & tongue Neck: no LAD, masses or thyromegaly Cardiovascular: RRR, no m/r/g. No LE edema. Respiratory: CTA bilaterally, no w/r/r. Normal respiratory effort. Abdomen: Somewhat rounded but soft positive bowel sounds throughout nontender to palpation no mass organomegaly noted Skin: no rash or induration seen on limited exam Musculoskeletal: grossly normal tone BUE/BLE Psychiatric: grossly normal mood and affect, speech fluent and appropriate Neurologic: grossly non-focal.          Labs on Admission:  Basic Metabolic Panel:  Recent Labs Lab 01/23/14 0442  NA 138  K 4.7  CL 98  CO2 26  GLUCOSE 171*  BUN 23  CREATININE 1.23  CALCIUM 9.3   Liver Function Tests: No results found for this basename: AST, ALT, ALKPHOS, BILITOT, PROT, ALBUMIN,  in the last 168 hours No results found for this basename: LIPASE, AMYLASE,  in the last 168 hours No results found for this basename: AMMONIA,  in the last 168 hours CBC:  Recent Labs Lab 01/23/14 0442  WBC 17.2*  NEUTROABS 14.2*  HGB 13.0  HCT 39.8  MCV 92.3  PLT 291   Cardiac Enzymes: No results found for this basename: CKTOTAL, CKMB, CKMBINDEX, TROPONINI,  in the last 168 hours  BNP (last 3 results) No results found for this basename: PROBNP,  in the last 8760 hours CBG: No results found for this basename: GLUCAP,  in the last 168 hours  Radiological Exams on Admission: Ct Cta Abd/pel W/cm &/or W/o Cm  01/23/2014   CLINICAL DATA:  Rectal bleeding. Elevated lactate. Evaluate for bowel ischemia.  EXAM: CTA ABDOMEN AND PELVIS wITHOUT AND WITH CONTRAST  TECHNIQUE: Multidetector CT imaging of the abdomen and pelvis was performed using the standard protocol during bolus administration of intravenous contrast.  Multiplanar reconstructed images and MIPs were obtained and reviewed to evaluate the vascular anatomy.  CONTRAST:  175mL OMNIPAQUE IOHEXOL 350 MG/ML SOLN  COMPARISON:  None.  FINDINGS: BODY WALL: Unremarkable.  LOWER CHEST: There is multi focal coronary artery atherosclerosis. No consolidation in the lower lungs.  ABDOMEN/PELVIS:  Liver: Diffuse fatty infiltration of the liver. No focal abnormality.  Biliary: Layering high density within the gallbladder is likely small stones. No gallbladder distention or inflammation.  Pancreas: Unremarkable.  Spleen: Unremarkable.  Adrenals: Unremarkable.  Kidneys and ureters: 14 by 11 mm stone in the interpolar left kidney. On the right there are 2 calculi, measuring up to 5 mm in the lower pole. There is no urinary obstruction. Bilateral sub cm low dense renal lesions most compatible with cysts.  Bladder: Unremarkable.  Reproductive: Unremarkable.  Bowel: There are distal colonic  diverticula. No other identifiable source of GI bleeding. There is no bowel thickening or major vessel occlusion to suggest ischemic bowel, as clinically questioned. There is diffuse submucosal fat deposition within stomach, proximal duodenum, and colon. This is a nonspecific finding; no evidence of active inflammatory bowel disease. Normal appendix.  Retroperitoneum: No mass or adenopathy.  Peritoneum: No ascites or pneumoperitoneum.  Vascular: Bilateral accessory renal arteries. Otherwise, the aortic branching pattern is standard. There is mild calcified and noncalcified plaque in the aorta and branch vessels. No evidence of significant stenosis. No major vessel occlusion. No evidence of vascular malformation in the right colon. The major venous structures are patent.  OSSEOUS: No acute osseous findings. Lower lumbar degenerative disc and facet disease. L4-5 posterior lumbar interbody fusion with rod and pedicle screw fixation. There is questionable fusion across the discectomy graft, but there may be  bridging endplate osteophytes.  Review of the MIP images confirms the above findings.  IMPRESSION: 1. Colonic diverticulosis. No other identifiable cause of GI bleeding. 2. Bilateral nephrolithiasis, including a 14 mm stone the left. 3. Cholelithiasis.   Electronically Signed   By: Jorje Guild M.D.   On: 01/23/2014 07:22    EKG:   Assessment/Plan Principal Problem:   PRB (rectal bleeding); likely related to colonic diverticula with cysts. Denies NSAID use. No previous colonoscopy. Evaluated by gastroenterology. Will likely need a colonoscopy. Will keep him n.p.o. until evaluated by gastroenterology. Currently he is hemodynamically stable with a hemoglobin of 13. Will monitor closely.  Active Problems:  Diverticulosis of colon: Patient reports history of same. Never had a colonoscopy. See #1. Provide Protonix daily. Serial CBCs. Await GI recommendations.    Polycythemia: Chart review indicates is chronic the patient denies ever having been informed of this diagnosis. Is not currently under any treatment. Review indicates liver function tests done in April of this year within the limits of normal Will likely need outpatient hematology followup.    Diabetes: Patient on metformin at home. Will obtain a hemoglobin A1c. Use sliding scale insulin for optimal control.    HYPERLIPIDEMIA: Lipid panel done in April of this year yields triglycerides 191 and HDL 25. Will continue his home medications    OBSTRUCTIVE SLEEP APNEA:     HYPERTENSION: Home medications include Toprol. Will continue this for now.    CORONARY ATHEROSCLEROSIS NATIVE CORONARY ARTERY: No chest pain. Continue his aspirin his statin and his beta blocker.    GERD: Stable. See #2     Dr Laural Golden gastroenterology  Code Status: full Family Communication: wife at bedside Disposition Plan: home when ready  Time spent: [facility] Pager [phone]  **Disclaimer: This note may have been dictated  with voice recognition software. Similar sounding words can inadvertently be transcribed and this note may contain transcription errors which may not have been corrected upon publication of note.**

## 2014-01-23 NOTE — ED Provider Notes (Signed)
Pt left at change of shift to get CT angio abd/pelvis results. Pt presented with rectal bleeding, has hx of diverticulosis, no abdominal pain. Pt has polycythemia with baseline Hb 15-19, today is 13.   Pt and wife given results of his CT scan, they are unaware of the polycythemia diagnosis and is not under treatment for it.    07:46 Dr Roderic Palau, admit to observation, tele  08:00 Dr Laural Golden will consult on patient.    Ct Cta Abd/pel W/cm &/or W/o Cm  01/23/2014   CLINICAL DATA:  Rectal bleeding. Elevated lactate. Evaluate for bowel ischemia.  EXAM: CTA ABDOMEN AND PELVIS wITHOUT AND WITH CONTRAST  TECHNIQUE: Multidetector CT imaging of the abdomen and pelvis was performed using the standard protocol during bolus administration of intravenous contrast. Multiplanar reconstructed images and MIPs were obtained and reviewed to evaluate the vascular anatomy.  CONTRAST:  142mL OMNIPAQUE IOHEXOL 350 MG/ML SOLN  COMPARISON:  None.  FINDINGS: BODY WALL: Unremarkable.  LOWER CHEST: There is multi focal coronary artery atherosclerosis. No consolidation in the lower lungs.  ABDOMEN/PELVIS:  Liver: Diffuse fatty infiltration of the liver. No focal abnormality.  Biliary: Layering high density within the gallbladder is likely small stones. No gallbladder distention or inflammation.  Pancreas: Unremarkable.  Spleen: Unremarkable.  Adrenals: Unremarkable.  Kidneys and ureters: 14 by 11 mm stone in the interpolar left kidney. On the right there are 2 calculi, measuring up to 5 mm in the lower pole. There is no urinary obstruction. Bilateral sub cm low dense renal lesions most compatible with cysts.  Bladder: Unremarkable.  Reproductive: Unremarkable.  Bowel: There are distal colonic diverticula. No other identifiable source of GI bleeding. There is no bowel thickening or major vessel occlusion to suggest ischemic bowel, as clinically questioned. There is diffuse submucosal fat deposition within stomach, proximal duodenum, and  colon. This is a nonspecific finding; no evidence of active inflammatory bowel disease. Normal appendix.  Retroperitoneum: No mass or adenopathy.  Peritoneum: No ascites or pneumoperitoneum.  Vascular: Bilateral accessory renal arteries. Otherwise, the aortic branching pattern is standard. There is mild calcified and noncalcified plaque in the aorta and branch vessels. No evidence of significant stenosis. No major vessel occlusion. No evidence of vascular malformation in the right colon. The major venous structures are patent.  OSSEOUS: No acute osseous findings. Lower lumbar degenerative disc and facet disease. L4-5 posterior lumbar interbody fusion with rod and pedicle screw fixation. There is questionable fusion across the discectomy graft, but there may be bridging endplate osteophytes.  Review of the MIP images confirms the above findings.  IMPRESSION: 1. Colonic diverticulosis. No other identifiable cause of GI bleeding. 2. Bilateral nephrolithiasis, including a 14 mm stone the left. 3. Cholelithiasis.   Electronically Signed   By: Jorje Guild M.D.   On: 01/23/2014 07:22    Diagnoses that have been ruled out:  None  Diagnoses that are still under consideration:  None  Final diagnoses:  PRB (rectal bleeding)     Plan admission   Rolland Porter, MD, Alanson Aly, MD 01/23/14 [phone]

## 2014-01-23 NOTE — Consult Note (Signed)
Reason for Consult: Rectal bleeding Referring Physician: Hospitalist  Jose Burns is an 63 y.o. male.  HPI: Admitted thru the ED today. He states he has been passing burgundy colored blood from his rectum.  Rectal bleeding started Saturday.Saturday describes as a significant amount. Monday and Tuesday there was no blood. Last night he began to have rectal bleeding again.  He describes as "alot". Denies prior hx of this. Last BM was around 230am and bloody.  There is no abdominal pain.  Appetite is good. No weight loss.  Usually has a BM once a day. Has never undergone a colonoscopy in the past. Takes a Baby ASA daily.  No NSAIDs beside the Baby ASA  Past Medical History  Diagnosis Date  . CAD (coronary artery disease)     s/p Non-ST-segment elevation myocardial infarction 05/27/2010; NSTEMI tx'd with Promus DES to LAD and Promus DES to OM2;  residual at cath 05/2010:  OM3 70% (small); D1 80%; CFx 40-50%; EF 55%  . Hyperlipidemia   . Polycythemia     with question of sleep apnea  . Obesity   . GERD (gastroesophageal reflux disease)   . HTN (hypertension)   . MI (myocardial infarction)   . Diabetes mellitus without complication   . COPD (chronic obstructive pulmonary disease)     Past Surgical History  Procedure Laterality Date  . Back surgery      x2  . Shoulder surgery  2007  . Stents placed      x2    Family History  Problem Relation Age of Onset  . Pneumonia Father   . Heart attack Sister   . Breast cancer Mother     Social History:  reports that he quit smoking about 3 years ago. His smoking use included Cigarettes. He has a 35 pack-year smoking history. He does not have any smokeless tobacco history on file. He reports that he drinks alcohol. He reports that he does not use illicit drugs.  Allergies: No Known Allergies  Medications: I have reviewed the patient's current medications.  Results for orders placed during the hospital encounter of 01/23/14 (from the  past 48 hour(s))  CBC WITH DIFFERENTIAL     Status: Abnormal   Collection Time    01/23/14  4:42 AM      Result Value Ref Range   WBC 17.2 (*) 4.0 - 10.5 K/uL   RBC 4.31  4.22 - 5.81 MIL/uL   Hemoglobin 13.0  13.0 - 17.0 g/dL   HCT 39.8  39.0 - 52.0 %   MCV 92.3  78.0 - 100.0 fL   MCH 30.2  26.0 - 34.0 pg   MCHC 32.7  30.0 - 36.0 g/dL   RDW 13.6  11.5 - 15.5 %   Platelets 291  150 - 400 K/uL   Neutrophils Relative % 81 (*) 43 - 77 %   Neutro Abs 14.2 (*) 1.7 - 7.7 K/uL   Lymphocytes Relative 8 (*) 12 - 46 %   Lymphs Abs 1.4  0.7 - 4.0 K/uL   Monocytes Relative 7  3 - 12 %   Monocytes Absolute 1.1 (*) 0.1 - 1.0 K/uL   Eosinophils Relative 3  0 - 5 %   Eosinophils Absolute 0.5  0.0 - 0.7 K/uL   Basophils Relative 1  0 - 1 %   Basophils Absolute 0.1  0.0 - 0.1 K/uL  BASIC METABOLIC PANEL     Status: Abnormal   Collection Time    01/23/14  4:42 AM      Result Value Ref Range   Sodium 138  137 - 147 mEq/L   Potassium 4.7  3.7 - 5.3 mEq/L   Chloride 98  96 - 112 mEq/L   CO2 26  19 - 32 mEq/L   Glucose, Bld 171 (*) 70 - 99 mg/dL   BUN 23  6 - 23 mg/dL   Creatinine, Ser 1.23  0.50 - 1.35 mg/dL   Calcium 9.3  8.4 - 10.5 mg/dL   GFR calc non Af Amer 61 (*) >90 mL/min   GFR calc Af Amer 71 (*) >90 mL/min   Comment: (NOTE)     The eGFR has been calculated using the CKD EPI equation.     This calculation has not been validated in all clinical situations.     eGFR's persistently <90 mL/min signify possible Chronic Kidney     Disease.  LACTIC ACID, PLASMA     Status: Abnormal   Collection Time    01/23/14  4:42 AM      Result Value Ref Range   Lactic Acid, Venous 2.8 (*) 0.5 - 2.2 mmol/L  POC OCCULT BLOOD, ED     Status: Abnormal   Collection Time    01/23/14  4:54 AM      Result Value Ref Range   Fecal Occult Bld POSITIVE (*) NEGATIVE    Ct Cta Abd/pel W/cm &/or W/o Cm  01/23/2014   CLINICAL DATA:  Rectal bleeding. Elevated lactate. Evaluate for bowel ischemia.  EXAM: CTA  ABDOMEN AND PELVIS wITHOUT AND WITH CONTRAST  TECHNIQUE: Multidetector CT imaging of the abdomen and pelvis was performed using the standard protocol during bolus administration of intravenous contrast. Multiplanar reconstructed images and MIPs were obtained and reviewed to evaluate the vascular anatomy.  CONTRAST:  158m OMNIPAQUE IOHEXOL 350 MG/ML SOLN  COMPARISON:  None.  FINDINGS: BODY WALL: Unremarkable.  LOWER CHEST: There is multi focal coronary artery atherosclerosis. No consolidation in the lower lungs.  ABDOMEN/PELVIS:  Liver: Diffuse fatty infiltration of the liver. No focal abnormality.  Biliary: Layering high density within the gallbladder is likely small stones. No gallbladder distention or inflammation.  Pancreas: Unremarkable.  Spleen: Unremarkable.  Adrenals: Unremarkable.  Kidneys and ureters: 14 by 11 mm stone in the interpolar left kidney. On the right there are 2 calculi, measuring up to 5 mm in the lower pole. There is no urinary obstruction. Bilateral sub cm low dense renal lesions most compatible with cysts.  Bladder: Unremarkable.  Reproductive: Unremarkable.  Bowel: There are distal colonic diverticula. No other identifiable source of GI bleeding. There is no bowel thickening or major vessel occlusion to suggest ischemic bowel, as clinically questioned. There is diffuse submucosal fat deposition within stomach, proximal duodenum, and colon. This is a nonspecific finding; no evidence of active inflammatory bowel disease. Normal appendix.  Retroperitoneum: No mass or adenopathy.  Peritoneum: No ascites or pneumoperitoneum.  Vascular: Bilateral accessory renal arteries. Otherwise, the aortic branching pattern is standard. There is mild calcified and noncalcified plaque in the aorta and branch vessels. No evidence of significant stenosis. No major vessel occlusion. No evidence of vascular malformation in the right colon. The major venous structures are patent.  OSSEOUS: No acute osseous  findings. Lower lumbar degenerative disc and facet disease. L4-5 posterior lumbar interbody fusion with rod and pedicle screw fixation. There is questionable fusion across the discectomy graft, but there may be bridging endplate osteophytes.  Review of the MIP images confirms the above findings.  IMPRESSION: 1. Colonic diverticulosis. No other identifiable cause of GI bleeding. 2. Bilateral nephrolithiasis, including a 14 mm stone the left. 3. Cholelithiasis.   Electronically Signed   By: Jorje Guild M.D.   On: 01/23/2014 07:22    ROS Blood pressure 117/65, pulse 75, temperature 98.1 F (36.7 C), temperature source Oral, resp. rate 16, height _0  (1.778 m), weight 219 lb (99.338 kg), SpO2 98.00%. Physical Exam Alert and oriented. Skin warm and dry. Oral mucosa is moist.   . Sclera anicteric, conjunctivae is pink. Thyroid not enlarged. No cervical lymphadenopathy. Lungs clear. Heart regular rate and rhythm.  Abdomen is soft. Bowel sounds are positive. No hepatomegaly. No abdominal masses felt. No tenderness.  No edema to lower extremities. Rectal exam deferred. Patient guaiac positive per ED records.  Assessment/Plan: Probable diverticular bleed. No BM since admission (4am).  Hemoglobin appears stable at this time. Will discuss with Dr Laural Golden.   SETZER,TERRI W 01/23/2014, 8:20 AM     GI attending note; Patient interviewed and examined. Patient presents with large volume painless hematochezia. First episode occurred over the weekend it subsided stopped again last night. He is on low-dose aspirin. He does not take NSAIDs. He has never undergone colonoscopy. It has been offered but he is declined. Abdominal exam within normal limits. His records reflect that he has polycythemia vera. Nor patient neither his wife are aware of this diagnosis. Suspect patient has colonic diverticular bleed and is agreeable to proceed with diagnostic colonoscopy. He has stopped bleeding. He just had a bowel  movement which I happen to look at in its formed stool.  Recommendations; Colonoscopy to be performed in a.m.Marland Kitchen

## 2014-01-23 NOTE — H&P (Signed)
Patient seen and examined. Note reviewed  He has been admitted with rectal bleeding. Hemoglobin has started to down, but he does not require transfusion at this time. We'll continue to monitor with serial CBCs. He's not had any further bleeding since coming to the hospital. Suspect he has a diverticular bleed. Plans are for colonoscopy by GI tomorrow.  Cindra Austad

## 2014-01-23 NOTE — ED Provider Notes (Signed)
CSN: [id number]     Arrival date & time 01/23/14  0416 History   First MD Initiated Contact with Patient 01/23/14 [id number]     Chief Complaint  Patient presents with  . Rectal Bleeding     (Consider location/radiation/quality/duration/timing/severity/associated sxs/prior Treatment) HPI Comments: 63 year old male with history of lipids, past tobacco abuse, sleep apnea, high blood pressure, heart attack, aspirin use presents with recurrent blood in his stools. Patient has had lighter color bleeding on and off for the past 4 days. Patient initially had 3 or 4 episodes on Sunday night it gradually improved and resolved however this evening at 3-4 watery nonbloody stools. Patient had brief episode of nausea however no abdominal pain or vomiting. No history of colonoscopy or known GI issues except once he was told he had diverticulosis.  His symptoms are intermittent and nothing specifically worsens.  Patient is a 63 y.o. male presenting with hematochezia. The history is provided by the patient.  Rectal Bleeding Associated symptoms: no abdominal pain, no fever, no light-headedness and no vomiting     Past Medical History  Diagnosis Date  . CAD (coronary artery disease)     s/p Non-ST-segment elevation myocardial infarction 05/27/2010; NSTEMI tx'd with Promus DES to LAD and Promus DES to OM2;  residual at cath 05/2010:  OM3 70% (small); D1 80%; CFx 40-50%; EF 55%  . Hyperlipidemia   . Polycythemia     with question of sleep apnea  . Obesity   . GERD (gastroesophageal reflux disease)   . HTN (hypertension)   . MI (myocardial infarction)   . Diabetes mellitus without complication   . COPD (chronic obstructive pulmonary disease)    Past Surgical History  Procedure Laterality Date  . Back surgery      x2  . Shoulder surgery  2007  . Stents placed      x2   Family History  Problem Relation Age of Onset  . Pneumonia Father   . Heart attack Sister   . Breast cancer Mother    History   Substance Use Topics  . Smoking status: Former Smoker -- 1.00 packs/day for 35 years    Types: Cigarettes    Quit date: 05/26/2010  . Smokeless tobacco: Not on file     Comment: smoked 0.5-1 ppd; started in 1970s and quit 05/26/2010  . Alcohol Use: Yes     Comment: rarely    Review of Systems  Constitutional: Negative for fever and chills.  HENT: Negative for congestion.   Eyes: Negative for visual disturbance.  Respiratory: Negative for shortness of breath.   Cardiovascular: Negative for chest pain.  Gastrointestinal: Positive for nausea, diarrhea, blood in stool and hematochezia. Negative for vomiting and abdominal pain.  Genitourinary: Negative for dysuria and flank pain.  Musculoskeletal: Negative for back pain, neck pain and neck stiffness.  Skin: Negative for rash.  Neurological: Negative for light-headedness and headaches.      Allergies  Review of patient's allergies indicates no known allergies.  Home Medications   Prior to Admission medications   Medication Sig Start Date End Date Taking? Authorizing Provider  acetaminophen (TYLENOL) 500 MG tablet One by mouth daily as needed    Yes Historical Provider, MD  aspirin 81 MG tablet Take 81 mg by mouth daily.   Yes Historical Provider, MD  atorvastatin (LIPITOR) 20 MG tablet Take 1 tablet (20 mg total) by mouth daily at 6 PM. 11/07/13  Yes Sherren Mocha, MD  metFORMIN (GLUCOPHAGE) 500 MG tablet Take 500  mg by mouth 2 (two) times daily with a meal.  08/27/13  Yes Historical Provider, MD  metoprolol succinate (TOPROL-XL) 50 MG 24 hr tablet Take 1 tablet (50 mg total) by mouth daily. 11/07/13  Yes Sherren Mocha, MD   BP 116/56  Pulse 78  Temp(Src) 98.1 F (36.7 C) (Oral)  Resp 20  Ht 5\' 10"  (1.778 m)  Wt 219 lb (99.338 kg)  BMI 31.42 kg/m2  SpO2 98% Physical Exam  Nursing note and vitals reviewed. Constitutional: He is oriented to person, place, and time. He appears well-developed and well-nourished.  HENT:  Head:  Normocephalic and atraumatic.  Eyes: Conjunctivae are normal. Right eye exhibits no discharge. Left eye exhibits no discharge.  Neck: Normal range of motion. Neck supple. No tracheal deviation present.  Cardiovascular: Normal rate and regular rhythm.   Pulmonary/Chest: Effort normal and breath sounds normal.  Abdominal: Soft. He exhibits no distension. There is no tenderness. There is no guarding.  Genitourinary:  Gross blood on rectal exam lighter color  Musculoskeletal: He exhibits no edema.  Neurological: He is alert and oriented to person, place, and time.  Skin: Skin is warm. No rash noted.  Psychiatric: He has a normal mood and affect.    ED Course  Procedures (including critical care time) Labs Review Labs Reviewed  CBC WITH DIFFERENTIAL - Abnormal; Notable for the following:    WBC 17.2 (*)    Neutrophils Relative % 81 (*)    Neutro Abs 14.2 (*)    Lymphocytes Relative 8 (*)    Monocytes Absolute 1.1 (*)    All other components within normal limits  BASIC METABOLIC PANEL - Abnormal; Notable for the following:    Glucose, Bld 171 (*)    GFR calc non Af Amer 61 (*)    GFR calc Af Amer 71 (*)    All other components within normal limits  LACTIC ACID, PLASMA - Abnormal; Notable for the following:    Lactic Acid, Venous 2.8 (*)    All other components within normal limits  POC OCCULT BLOOD, ED - Abnormal; Notable for the following:    Fecal Occult Bld POSITIVE (*)    All other components within normal limits    Imaging Review No results found.   EKG Interpretation None      MDM   Final diagnoses:  None   Patient is well-appearing, smiling in the room which is reassuring. His vitals are normal. Pt is on asa.  His had intermittent rectal bleeding and I discussed the differential diagnosis with him. Plan for observation in the ER, blood work and decision for either observation in the hospital versus close outpatient followup with gastroenterology for  colonoscopy.  Patient's blood work showed mild lactic acidosis and leukocytosis with differential being diverticular, vascular specialist is vascular history, other. CT scan ordered for further evaluation Patient signed a ED provider to followup CT scan results and reassess the patient to determine final disposition.  On recheck patient well-appearing, no bowel pain and no active bleeding.  GI bleeding   Mariea Clonts, MD 01/23/14 [phone]

## 2014-01-23 NOTE — ED Notes (Signed)
Pt reports episodes of rectal bleeding occuring on and off x 4 days. States it got worse last night, pt seeing dark blood in toilet, but denies any blood in underwear.

## 2014-01-24 ENCOUNTER — Inpatient Hospital Stay (HOSPITAL_COMMUNITY): Payer: BC Managed Care – PPO

## 2014-01-24 ENCOUNTER — Encounter (HOSPITAL_COMMUNITY): Payer: Self-pay | Admitting: *Deleted

## 2014-01-24 ENCOUNTER — Encounter (HOSPITAL_COMMUNITY): Admission: EM | Disposition: A | Discharge status: Home / Self Care | Payer: Self-pay | Source: Home / Self Care

## 2014-01-24 DIAGNOSIS — K5730 Diverticulosis of large intestine without perforation or abscess without bleeding: Secondary | ICD-10-CM

## 2014-01-24 DIAGNOSIS — D126 Benign neoplasm of colon, unspecified: Secondary | ICD-10-CM

## 2014-01-24 DIAGNOSIS — I10 Essential (primary) hypertension: Secondary | ICD-10-CM

## 2014-01-24 DIAGNOSIS — K625 Hemorrhage of anus and rectum: Secondary | ICD-10-CM

## 2014-01-24 HISTORY — PX: BIOPSY: SHX5522

## 2014-01-24 HISTORY — PX: POLYPECTOMY: SHX5525

## 2014-01-24 HISTORY — PX: COLONOSCOPY: SHX5424

## 2014-01-24 LAB — CBC
HEMATOCRIT: 33.6 % — AB (ref 39.0–52.0)
Hemoglobin: 11.1 g/dL — ABNORMAL LOW (ref 13.0–17.0)
MCH: 30.2 pg (ref 26.0–34.0)
MCHC: 33 g/dL (ref 30.0–36.0)
MCV: 91.6 fL (ref 78.0–100.0)
PLATELETS: 237 10*3/uL (ref 150–400)
RBC: 3.67 MIL/uL — ABNORMAL LOW (ref 4.22–5.81)
RDW: 13.8 % (ref 11.5–15.5)
WBC: 9.5 10*3/uL (ref 4.0–10.5)

## 2014-01-24 LAB — GLUCOSE, CAPILLARY
GLUCOSE-CAPILLARY: 135 mg/dL — AB (ref 70–99)
Glucose-Capillary: 143 mg/dL — ABNORMAL HIGH (ref 70–99)
Glucose-Capillary: 124 mg/dL — ABNORMAL HIGH (ref 70–99)
Glucose-Capillary: 108 mg/dL — ABNORMAL HIGH (ref 70–99)

## 2014-01-24 SURGERY — COLONOSCOPY
Anesthesia: Moderate Sedation

## 2014-01-24 MED ORDER — STERILE WATER FOR IRRIGATION IR SOLN
Status: DC | PRN
  Administered 2014-01-24: 11:00:00

## 2014-01-24 MED ORDER — PANTOPRAZOLE SODIUM 40 MG PO TBEC: 40.0000 mg | DELAYED_RELEASE_TABLET | Freq: Every day | ORAL | Status: DC

## 2014-01-24 MED ORDER — MIDAZOLAM HCL 5 MG/5ML IJ SOLN
INTRAMUSCULAR | Status: DC | PRN
  Administered 2014-01-24 (×2): 2 mg via INTRAVENOUS
  Administered 2014-01-24 (×4): 1 mg via INTRAVENOUS

## 2014-01-24 MED ORDER — MEPERIDINE HCL 50 MG/ML IJ SOLN
INTRAMUSCULAR | Status: AC
  Filled 2014-01-24: qty 1

## 2014-01-24 MED ORDER — SODIUM CHLORIDE 0.9 % IV SOLN: INTRAVENOUS | Status: DC

## 2014-01-24 MED ORDER — MIDAZOLAM HCL 5 MG/5ML IJ SOLN
INTRAMUSCULAR | Status: AC
  Filled 2014-01-24: qty 10

## 2014-01-24 MED ORDER — MEPERIDINE HCL 50 MG/ML IJ SOLN
INTRAMUSCULAR | Status: DC | PRN
  Administered 2014-01-24 (×2): 25 mg via INTRAVENOUS

## 2014-01-24 NOTE — Care Management Utilization Note (Signed)
UR completed 

## 2014-01-24 NOTE — Discharge Summary (Signed)
Physician Discharge Summary  Jose Burns PRX:[id number] DOB: Nov 24, 1950 DOA: 01/23/2014  PCP: Lonzo Cloud, MD  Admit date: 01/23/2014 Discharge date: 01/24/2014  Time spent: 40 minutes  Recommendations for Outpatient Follow-up:  1. Dr. Olevia Perches office will contact patient for follow up appointment and results of biopsy.  2. Dr. Raynelle Jan PCP 1 week for follow up on polycythemia  Discharge Diagnoses:  Principal Problem:   PRB (rectal bleeding) Active Problems:   HYPERLIPIDEMIA   OBSTRUCTIVE SLEEP APNEA   HYPERTENSION   CORONARY ATHEROSCLEROSIS NATIVE CORONARY ARTERY   GERD   Diverticulosis of colon   Polycythemia   Diabetes   Rectal bleeding   Discharge Condition: stable   Diet recommendation: carb modified  Filed Weights   01/23/14 0430  Weight: 99.338 kg (219 lb)    History of present illness:  Jose Burns is a 63 y.o. male with a past medical history that includes diabetes, CAD, hypertension, presented to the emergency department on 01/23/14 with the chief complaint of rectal bleeding. Initial evaluation revealed colonic diverticulosis.  Patient reported that for the previous 3 or 4 days he'd experienced intermittent soft maroon stools with clots. He reported the first one or 2 days there was a "significant amount" of maroon blood in his stools but he did not report frequent bowel movements. He reported for the previous 2 days the bleeding seemed to have resolved and then early the morning of presentation he had a bowel movement that was mostly maroon blood with clots. He denied dark stools or diarrhea. He denied abdominal pain nausea vomiting. He denied chest pain palpitation shortness of breath dizziness syncope or near-syncope. He takes one baby aspirin daily and reported no other NSAID use. He had never had a colonoscopy before.  Workup in the emergency department included a basic metabolic panel significant for serum glucose of 171, complete blood count significant for a  white count of 17.2. FOBT positive. CT of abdomen pelvis yielded colonic diverticulosis. No other identifiable cause of GI bleeding. Bilateral nephrolithiasis, including a 14 mm stone the left. Cholelithiasis.  The time of my exam he is hemodynamically stable afebrile nontoxic appearing. He was not hypoxic.  Hospital Course:  Hematochezia (rectal bleeding); admitted to medical floor. Hg remained stable. Tolerated bowel prep. No further episodes hematochezia. Colonoscopy 01/24/14 revealed redundant colon, pancolonic diverticulosis, 10 mm polyp, small polyp hepatic flexure. 15 x 10 mm flat ulcerated lesion at splenic flexure. Lesion biopsied for histology per GI. At discharge patient alert and tolerating diet without problem. Will hold aspirin until Dr. Laural Golden resume's.  Dr Olevia Perches office will contact with biopsy results and schedule follow up. Active Problems:  Diverticulosis of colon: See #1. Provide Protonix daily. Serial CBCs.    Polycythemia: Chart review indicates is chronic the patient denies ever having been informed of this diagnosis. Is not currently under any treatment. Review indicates liver function tests done in April of this year within the limits of normal.  Recommend outpatient hematology followup.   Diabetes: Patient on metformin at home. Hemoglobin A1c 6.2.    HYPERLIPIDEMIA: Lipid panel done in April of this year yields triglycerides 191 and HDL 25.   OBSTRUCTIVE SLEEP APNEA:   HYPERTENSION: controlled. Resume home meds   CORONARY ATHEROSCLEROSIS NATIVE CORONARY ARTERY: No chest pain. Hold aspirin until cleared by Dr Laural Golden to resume. Continue statin and beta blocker.   GERD: Stable. See #2  Procedures: Colonoscopy 01/24/14 Examination performed to cecum. Redundant colon. Pancolonic diverticulosis. 10 mm polyp hot snared from ascending colon. Small  polyp hot snared from hepatic flexure. Another small polyp and this area was coagulated using snare tip. 15 x 10 mm flat ulcerated  lesion at splenic flexure. Lesion biopsied for histology. Persistent bleeding from biopsy site controlled with application of 2 instinct clips. Spotts dye injected above and below this lesion.    Consultations:  Dr Laural Golden gastroenterology  Discharge Exam: Danley Danker Vitals:   01/24/14 1325  BP: 114/65  Pulse: 67  Temp:   Resp: 18    General: well nourished NAD Cardiovascular: RRR No MGR No LE edema Respiratory: normal effort BS clear bilaterally no wheeze Abdomen: round soft +BS non-tender to palpation  Discharge Instructions You were cared for by a hospitalist during your hospital stay. If you have any questions about your discharge medications or the care you received while you were in the hospital after you are discharged, you can call the unit and asked to speak with the hospitalist on call if the hospitalist that took care of you is not available. Once you are discharged, your primary care physician will handle any further medical issues. Please note that NO REFILLS for any discharge medications will be authorized once you are discharged, as it is imperative that you return to your primary care physician (or establish a relationship with a primary care physician if you do not have one) for your aftercare needs so that they can reassess your need for medications and monitor your lab values.     Medication List    STOP taking these medications       aspirin 81 MG tablet      TAKE these medications       acetaminophen 500 MG tablet  Commonly known as:  TYLENOL  Take 1,000 mg by mouth every 6 (six) hours as needed for mild pain. One by mouth daily as needed     atorvastatin 20 MG tablet  Commonly known as:  LIPITOR  Take 1 tablet (20 mg total) by mouth daily at 6 PM.     metFORMIN 500 MG tablet  Commonly known as:  GLUCOPHAGE  Take 500 mg by mouth 2 (two) times daily with a meal.     metoprolol succinate 50 MG 24 hr tablet  Commonly known as:  TOPROL-XL  Take 1 tablet (50  mg total) by mouth daily.     pantoprazole 40 MG tablet  Commonly known as:  PROTONIX  Take 1 tablet (40 mg total) by mouth daily.       No Known Allergies    The results of significant diagnostics from this hospitalization (including imaging, microbiology, ancillary and laboratory) are listed below for reference.    Significant Diagnostic Studies: Dg Abd 1 View  01/24/2014   CLINICAL DATA:  Clip location  EXAM: ABDOMEN - 1 VIEW  COMPARISON:  CT abdomen and pelvis 01/23/2014  FINDINGS: Prior L4-L5 fusion with disc prosthesis.  No additional surgical hardware or clip identified.  Cranial portion of upper abdomen excluded, particularly on LEFT.  Large LEFT renal calculus 12 x 8 mm.  Bowel gas pattern normal.  Bones demineralized.  IMPRESSION: No definite surgical clip identified as above.  Post L4-L5 fusion.  12 x 8 mm LEFT renal calculus.   Electronically Signed   By: Lavonia Dana M.D.   On: 01/24/2014 13:18   Ct Cta Abd/pel W/cm &/or W/o Cm  01/23/2014   CLINICAL DATA:  Rectal bleeding. Elevated lactate. Evaluate for bowel ischemia.  EXAM: CTA ABDOMEN AND PELVIS wITHOUT AND WITH CONTRAST  TECHNIQUE: Multidetector CT imaging of the abdomen and pelvis was performed using the standard protocol during bolus administration of intravenous contrast. Multiplanar reconstructed images and MIPs were obtained and reviewed to evaluate the vascular anatomy.  CONTRAST:  162mL OMNIPAQUE IOHEXOL 350 MG/ML SOLN  COMPARISON:  None.  FINDINGS: BODY WALL: Unremarkable.  LOWER CHEST: There is multi focal coronary artery atherosclerosis. No consolidation in the lower lungs.  ABDOMEN/PELVIS:  Liver: Diffuse fatty infiltration of the liver. No focal abnormality.  Biliary: Layering high density within the gallbladder is likely small stones. No gallbladder distention or inflammation.  Pancreas: Unremarkable.  Spleen: Unremarkable.  Adrenals: Unremarkable.  Kidneys and ureters: 14 by 11 mm stone in the interpolar left kidney.  On the right there are 2 calculi, measuring up to 5 mm in the lower pole. There is no urinary obstruction. Bilateral sub cm low dense renal lesions most compatible with cysts.  Bladder: Unremarkable.  Reproductive: Unremarkable.  Bowel: There are distal colonic diverticula. No other identifiable source of GI bleeding. There is no bowel thickening or major vessel occlusion to suggest ischemic bowel, as clinically questioned. There is diffuse submucosal fat deposition within stomach, proximal duodenum, and colon. This is a nonspecific finding; no evidence of active inflammatory bowel disease. Normal appendix.  Retroperitoneum: No mass or adenopathy.  Peritoneum: No ascites or pneumoperitoneum.  Vascular: Bilateral accessory renal arteries. Otherwise, the aortic branching pattern is standard. There is mild calcified and noncalcified plaque in the aorta and branch vessels. No evidence of significant stenosis. No major vessel occlusion. No evidence of vascular malformation in the right colon. The major venous structures are patent.  OSSEOUS: No acute osseous findings. Lower lumbar degenerative disc and facet disease. L4-5 posterior lumbar interbody fusion with rod and pedicle screw fixation. There is questionable fusion across the discectomy graft, but there may be bridging endplate osteophytes.  Review of the MIP images confirms the above findings.  IMPRESSION: 1. Colonic diverticulosis. No other identifiable cause of GI bleeding. 2. Bilateral nephrolithiasis, including a 14 mm stone the left. 3. Cholelithiasis.   Electronically Signed   By: Jorje Guild M.D.   On: 01/23/2014 07:22    Microbiology: No results found for this or any previous visit (from the past 240 hour(s)).   Labs: Basic Metabolic Panel:  Recent Labs Lab 01/23/14 0442  NA 138  K 4.7  CL 98  CO2 26  GLUCOSE 171*  BUN 23  CREATININE 1.23  CALCIUM 9.3   Liver Function Tests: No results found for this basename: AST, ALT, ALKPHOS,  BILITOT, PROT, ALBUMIN,  in the last 168 hours No results found for this basename: LIPASE, AMYLASE,  in the last 168 hours No results found for this basename: AMMONIA,  in the last 168 hours CBC:  Recent Labs Lab 01/23/14 0442 01/23/14 1413 01/23/14 2153 01/24/14 0545  WBC 17.2* 11.4* 10.7* 9.5  NEUTROABS 14.2*  --   --   --   HGB 13.0 11.6* 11.1* 11.1*  HCT 39.8 35.0* 33.6* 33.6*  MCV 92.3 92.1 91.8 91.6  PLT 291 253 229 237   Cardiac Enzymes: No results found for this basename: CKTOTAL, CKMB, CKMBINDEX, TROPONINI,  in the last 168 hours BNP: BNP (last 3 results) No results found for this basename: PROBNP,  in the last 8760 hours CBG:  Recent Labs Lab 01/23/14 1102 01/23/14 1702 01/23/14 2110 01/24/14 0826 01/24/14 1043  GLUCAP 122* 104* 99 143* 124*       Signed:  Dyanne Carrel M  [facility] 01/24/2014, 1:48 PM

## 2014-01-24 NOTE — Care Management Note (Signed)
    Page 1 of 1   01/24/2014     4:47:07 PM CARE MANAGEMENT NOTE 01/24/2014  Patient:  [facility]   Account Number:  [id number]  Date Initiated:  01/24/2014  Documentation initiated by:  Vladimir Creeks  Subjective/Objective Assessment:   Admitted with GIB. Pt is from home, lives with spouse, is independent and will return home at D/C. He is planning to go home this afternoon.     Action/Plan:   No needs identified   Anticipated DC Date:  01/24/2014   Anticipated DC Plan:  [place]  CM consult      Choice offered to / List presented to:             Status of service:  Completed, signed off Medicare Important Message given?   (If response is "NO", the following Medicare IM given date fields will be blank) Date Medicare IM given:   Medicare IM given by:   Date Additional Medicare IM given:   Additional Medicare IM given by:    Discharge Disposition:  HOME/SELF CARE  Per UR Regulation:  Reviewed for med. necessity/level of care/duration of stay  If discussed at [place] of Stay Meetings, dates discussed:    Comments:  01/24/14 [place]

## 2014-01-24 NOTE — Progress Notes (Signed)
Patient returned from colonoscopy. Alert and oriented. Sitting up on edge of bed. No complaints voiced. Eating lunch at this time

## 2014-01-24 NOTE — Progress Notes (Signed)
IV removed discharge instructions reviewed with patient and wife. Understanding verbalized. Ready for discharge home.

## 2014-01-24 NOTE — Progress Notes (Signed)
Patient drank about 1/3 of golyghtly. Stool clear with flecks of stool. Endo notified. They will come get patient soon.

## 2014-01-24 NOTE — Op Note (Addendum)
COLONOSCOPY PROCEDURE REPORT  PATIENT:  Jose Burns  MR#:  [id number] Birthdate:  1950/09/25, 63 y.o., male Endoscopist:  Dr. Rogene Houston, MD Referred By:  Dr. Kathie Dike, MD Procedure Date: 01/24/2014  Procedure:   Colonoscopy  Indications:  Patient is 63 year-old Caucasian male who presents with large volume painless hematochezia.  Informed Consent:  The procedure and risks were reviewed with the patient and informed consent was obtained.  Medications:  Demerol 50 mg IV Versed 8 mg IV  Description of procedure:  After a digital rectal exam was performed, that colonoscope was advanced from the anus through the rectum and colon to the area of the cecum, ileocecal valve and appendiceal orifice. The cecum was deeply intubated. These structures were well-seen and photographed for the record. From the level of the cecum and ileocecal valve, the scope was slowly and cautiously withdrawn. The mucosal surfaces were carefully surveyed utilizing scope tip to flexion to facilitate fold flattening as needed. The scope was pulled down into the rectum where a thorough exam including retroflexion was performed.  Findings:   Prep excellent. Redundant colon. Lipomatous ileocecal valve. 10 mm polyp snared from ascending colon. It broke into pieces unretrievable. Small polyp at hepatic flexure was snared. Another small polyp at hepatic flexure was coagulated using snare tip. Multiple diverticula scattered throughout the colon but most of these were at sigmoid and transverse colon. 15 x 10 mm flat ulcerated lesion in the region of splenic flexure. Lesion was biopsied for routine histology. Slow bleed from biopsy site; therefore 2 instinct that supplied and hemostasis secured. Spotts dye injected into submucosa above and below this lesion. Normal rectal mucosa and anal rectal junction.    Therapeutic/Diagnostic Maneuvers Performed:  See above  Complications:  None  Cecal Withdrawal Time:   41  minutes  Impression:  Examination performed to cecum. Redundant colon. Pancolonic diverticulosis. 10 mm polyp hot snared from ascending colon. Small polyp hot snared from hepatic flexure. Another small polyp and this area was coagulated using snare tip. 15 x 10 mm flat ulcerated lesion at splenic flexure. Lesion biopsied for histology. Persistent bleeding from biopsy site controlled with application of 2 instinct clips. Spotts dye injected above and below this lesion.  Recommendations:  Will obtain KUB for clip location. Modified carb diet. No aspirin or NSAIDs for 1 week. I will be contacting patient biopsy results and further recommendations.  Javonni Macke U  01/24/2014 1:03 PM  CC: Dr. Lonzo Cloud, MD & Dr. Rayne Du ref. provider found

## 2014-01-24 NOTE — Discharge Summary (Signed)
Patient seen and examined. Note reviewed.  The patient underwent colonoscopy today. Results discussed with Dr. Laural Golden. He was found to have a 15 x 10 mm flat ulcerated lesion in the region of the splenic flexure. This was biopsied and resulted in bleeding. Hemostasis was achieved by placing clips. Recommendations are not to resume aspirin until cleared by Dr. Laural Golden. Biopsy results will be conveyed to patient by Dr. Olevia Perches office. His hemoglobin has been stable and has not had any significant bleeding. He has been cleared to discharge home by GI.  Jose Burns

## 2014-01-28 ENCOUNTER — Encounter (HOSPITAL_COMMUNITY): Payer: Self-pay | Admitting: Internal Medicine

## 2014-02-02 NOTE — H&P (Signed)
  NTS SOAP Note  Vital Signs:  Vitals as of: 0/02/6760: Systolic 950: Diastolic 73: Heart Rate 76: Temp 97.21F: Height 64ft 10in: Weight 226Lbs 0 Ounces: BMI 32.43  BMI : 32.43 kg/m2  Subjective: This 63 Years 62 Months old Male presents for of colon cancer.  Found on TCS by Dr. Laural Golden.  Biopsy proven malignancy at the splenic flexure.  Clips were left as patient had bleeding from polypectomy.  No family h/o colon cancer.  Does have personal h/o diverticulosis.  Review of Symptoms:  Constitutional:unremarkable   Head:unremarkable    Eyes:unremarkable   Nose/Mouth/Throat:unremarkable Cardiovascular:  unremarkable   Respiratory:unremarkable   Gastrointestinal:  unremarkable   Genitourinary:unremarkable     Musculoskeletal:unremarkable   Skin:unremarkable Hematolgic/Lymphatic:unremarkable     Allergic/Immunologic:unremarkable     Past Medical History:    Reviewed  Past Medical History  Surgical History: MI, back surgery, shoulder surgery Medical Problems: CAD, HTN, high cholesterol, NIDDM Allergies: nkda Medications: lipitor, toprol, metformin, protonix   Social History:Reviewed  Social History  Preferred Language: English Race:  White Ethnicity: Not Hispanic / Latino Age: 63 Years 10 Months Marital Status:  M Alcohol: no   Smoking Status: Never smoker reviewed on 01/31/2014 Functional Status reviewed on 01/31/2014 ------------------------------------------------ Bathing: Normal Cooking: Normal Dressing: Normal Driving: Normal Eating: Normal Managing Meds: Normal Oral Care: Normal Shopping: Normal Toileting: Normal Transferring: Normal Walking: Normal Cognitive Status reviewed on 01/31/2014 ------------------------------------------------ Attention: Normal Decision Making: Normal Language: Normal Memory: Normal Motor: Normal Perception: Normal Problem Solving: Normal Visual and Spatial: Normal   Family  History:  Reviewed  Family Health History Family History is Unknown    Objective Information: General:  Well appearing, well nourished in no distress. Neck:  Supple without lymphadenopathy.  Heart:  RRR, no murmur or gallop.  Normal S1, S2.  No S3, S4.  Lungs:    CTA bilaterally, no wheezes, rhonchi, rales.  Breathing unlabored. Abdomen:Soft, NT/ND, normal bowel sounds, no HSM, no masses.  No peritoneal signs.  Assessment:Colon cancer  Diagnoses: 153.9 Primary malignant neoplasm of colon (Malignant neoplasm of colon, unspecified)  Procedures: 93267 - OFFICE OUTPATIENT NEW 30 MINUTES    Plan:  Scheduled for laparoscopic handassisted partial colectomy on 02/13/14.  Trilyte, neomycin, and erythromycin base were prescribed.   Patient Education:Alternative treatments to surgery were discussed with patient (and family).  Risks and benefits  of procedure including bleeding, infection, the possibility of an open procedure, as well as a blood transfusion were fully explained to the patient (and family) who gave informed consent. Patient/family questions were addressed.  Follow-up:Pending Surgery

## 2014-02-05 NOTE — Patient Instructions (Addendum)
Jose Burns  02/05/2014   Your procedure is scheduled on: 02/11/2014  Report to [facility] at 0900 AM.  Call this number if you have problems the morning of surgery: [phone]   Remember:   Do not eat food or drink liquids after midnight.   Take these medicines the morning of surgery with A SIP OF WATER: Toprol-XL Protonix   Do not wear jewelry, make-up or nail polish.  Do not wear lotions, powders, or perfumes. You may wear deodorant.  Do not shave 48 hours prior to surgery. Men may shave face and neck.  Do not bring valuables to the hospital.  [facility] is not responsible for any belongings or valuables.               Contacts, dentures or bridgework may not be worn into surgery.  Leave suitcase in the car. After surgery it may be brought to your room.  For patients admitted to the hospital, discharge time is determined by your treatment team.               Patients discharged the day of surgery will not be allowed to drive home.  Name and phone number of your driver:  Special Instructions: Shower using CHG 2 nights before surgery and the night before surgery.  If you shower the day of surgery use CHG.  Use special wash - you have one bottle of CHG for all showers.  You should use approximately 1/3 of the bottle for each shower.   Please read over the following fact sheets that you were given: Pain Booklet, Coughing and Deep Breathing, Blood Transfusion Information, Surgical Site Infection Prevention and Care and Recovery After Surgery  PATIENT INSTRUCTIONS POST-ANESTHESIA  IMMEDIATELY FOLLOWING SURGERY:  Do not drive or operate machinery for the first twenty four hours after surgery.  Do not make any important decisions for twenty four hours after surgery or while taking narcotic pain medications or sedatives.  If you develop intractable nausea and vomiting or a severe headache please notify your doctor immediately.  FOLLOW-UP:  Please make an appointment with your surgeon  as instructed. You do not need to follow up with anesthesia unless specifically instructed to do so.  WOUND CARE INSTRUCTIONS (if applicable):  Keep a dry clean dressing on the anesthesia/puncture wound site if there is drainage.  Once the wound has quit draining you may leave it open to air.  Generally you should leave the bandage intact for twenty four hours unless there is drainage.  If the epidural site drains for more than 36-48 hours please call the anesthesia department.  QUESTIONS?:  Please feel free to call your physician or the hospital operator if you have any questions, and they will be happy to assist you.      Laparoscopic Colectomy Laparoscopic colectomy is surgery to remove part or all of the large intestine (colon). This procedure is used to treat several conditions, including:  Inflammation and infection of the colon (diverticulitis).  Tumors or masses in the colon.  Inflammatory bowel disease, such as Crohn disease or ulcerative colitis. Colectomy is an option when symptoms cannot be controlled with medicines.  Bleeding from the colon that cannot be controlled by another method.  Blockage or obstruction of the colon. LET [facility] CARE PROVIDER KNOW ABOUT:  Any allergies you have.  All medicines you are taking, including vitamins, herbs, eye drops, creams, and over-the-counter medicines.  Previous problems you or members of your family have had with the  use of anesthetics.  Any blood disorders you have.  Previous surgeries you have had.  Medical conditions you have. RISKS AND COMPLICATIONS Generally, this is a safe procedure. However, as with any procedure, complications can occur. Possible complications include:  Infection.  Bleeding.  Damage to other organs.  Leaking from where the colon was sewn together.  Future blockage of the small intestines from scar tissue. Another surgery may be needed to repair this. In some cases, complications such as  damage to other organs or excessive bleeding may require the surgeon to convert from a laparoscopic procedure to an open procedure. This involves making a larger incision in the abdomen to perform the procedure. BEFORE THE PROCEDURE  Ask your health care provider about changing or stopping any regular medicines.  You may be prescribed an oral bowel prep. This involves drinking a large amount of medicated liquid, starting the day before your surgery. The liquid will cause you to have multiple loose stools until your stool is almost clear or light green. This cleans out your colon in preparation for the surgery.  Do not eat or drink anything else once you have started the bowel prep, unless your health care provider tells you it is safe to do so.  You may also be given antibiotic pills to clean out your colon of bacteria. Be sure to follow the directions carefully and take the medicine at the correct time. PROCEDURE   Small monitors will be put on your body. They are used to check your heart, blood pressure, and oxygen level.  An IV access tube will be put into one of your veins. Medicine will be able to flow directly into your body through this IV tube.  You might be given a medicine to help you relax (sedative).  You will be given a medicine to make you sleep through the procedure (general anesthetic). A breathing tube may be placed into your lungs during the procedure.  A thin, flexible tube (catheter) will be placed into your bladder to collect urine.  A tube may be put in through your nose. It is called a nasogastric tube. It is used to remove stomach fluids after surgery until the intestines start working again.  Your abdomen will be filled with air so that it expands. This gives the surgeon more room to operate and makes your organs easier to see.  Several small cuts (incisions) are made in your abdomen.  A thin, lighted tube with a tiny camera on the end (laparoscope) is put through  one of the small incisions. The camera on the laparoscope sends a picture to a TV screen in the operating room. This gives the surgeon a good view inside your abdomen.  Hollow tubes are put through the other small incisions in your abdomen. The tools needed for the procedure are put through these tubes.  Clamps or staples are put on both ends of the diseased part of the colon.  The part of the intestine between the clamps or staples is removed.  If possible, the ends of the healthy colon that remain will be stitched or stapled together to allow your body to expel waste (stool).  Sometimes, the remaining colon cannot be stitched back together. If this is the case, a colostomy is needed. For a colostomy:  An opening (stoma) to the outside of your body is made through the abdomen.  The end of the colon is brought to the opening. It is stitched to the skin.  A bag  is attached to the opening. Stool will drain into this bag. The bag is removable.  The colostomy can be temporary or permanent.  The incisions from the colectomy are closed with stitches or staples. AFTER THE PROCEDURE  You will be monitored closely in a recovery area until you are stable and doing well. You will then be moved to a regular hospital room.  You will need to receive fluids through an IV tube until your bowel function has returned. This may take 1-3 days. Once your bowels are working again, you will be started on clear liquids and then advanced to solid food as tolerated.  You will be given pain medicines to control your pain. Document Released: 10/02/2002 Document Revised: 05/02/2013 Document Reviewed: 02/21/2013 [facility] Patient Information 2015 [place], [place]. This information is not intended to replace advice given to you by your health care provider. Make sure you discuss any questions you have with your health care provider.   Incentive Spirometer An incentive spirometer is a tool that can help keep your  lungs clear and active. This tool measures how well you are filling your lungs with each breath. Taking long deep breaths may help reverse or decrease the chance of developing breathing (pulmonary) problems (especially infection) following:  Surgery of the chest or abdomen.  Surgery if you have a history of smoking or a lung problem.  A long period of time when you are unable to move or be active. BEFORE THE PROCEDURE   If the spirometer includes an indictor to show your best effort, your nurse or respiratory therapist will set it to a desired goal.  If possible, sit up straight or lean slightly forward. Try not to slouch.  Hold the incentive spirometer in an upright position. INSTRUCTIONS FOR USE  1. Sit on the edge of your bed if possible, or sit up as far as you can in bed or on a chair. 2. Hold the incentive spirometer in an upright position. 3. Breathe out normally. 4. Place the mouthpiece in your mouth and seal your lips tightly around it. 5. Breathe in slowly and as deeply as possible, raising the piston or the ball toward the top of the column. 6. Hold your breath for 3-5 seconds or for as long as possible. Allow the piston or ball to fall to the bottom of the column. 7. Remove the mouthpiece from your mouth and breathe out normally. 8. Rest for a few seconds and repeat Steps 1 through 7 at least 10 times every 1-2 hours when you are awake. Take your time and take a few normal breaths between deep breaths. 9. The spirometer may include an indicator to show your best effort. Use the indicator as a goal to work toward during each repetition. 10. After each set of 10 deep breaths, practice coughing to be sure your lungs are clear. If you have an incision (the cut made at the time of surgery), support your incision when coughing by placing a pillow or rolled up towels firmly against it. Once you are able to get out of bed, walk around indoors and cough well. You may stop using the  incentive spirometer when instructed by your caregiver.  RISKS AND COMPLICATIONS  Breathing too quickly may cause dizziness. At an extreme, this could cause you to pass out. Take your time so you do not get dizzy or light-headed.  If you are in pain, you may need to take or ask for pain medication before doing incentive spirometry. It  is harder to take a deep breath if you are having pain. AFTER USE  Rest and breathe slowly and easily.  It can be helpful to keep track of a log of your progress. Your caregiver can provide you with a simple table to help with this. If you are using the spirometer at home, follow these instructions: [place] IF:   You are having difficultly using the spirometer.  You have trouble using the spirometer as often as instructed.  Your pain medication is not giving enough relief while using the spirometer.  You develop fever of 100.5 F (38.1 C) or higher. SEEK IMMEDIATE MEDICAL CARE IF:   You cough up bloody sputum that had not been present before.  You develop fever of 102 F (38.9 C) or greater.  You develop worsening pain at or near the incision site. MAKE SURE YOU:   Understand these instructions.  Will watch your condition.  Will get help right away if you are not doing well or get worse. Document Released: 11/22/2006 Document Revised: 10/04/2011 Document Reviewed: 01/23/2007 [facility] Patient Information 2015 [place], [place]. This information is not intended to replace advice given to you by your health care provider. Make sure you discuss any questions you have with your health care provider. Laparoscopic Colectomy, Care After Refer to this sheet in the next few weeks. These instructions provide you with information on caring for yourself after your procedure. Your health care provider may also give you more specific instructions. Your treatment has been planned according to current medical practices, but problems sometimes occur. Call  your health care provider if you have any problems or questions after your procedure. WHAT TO EXPECT AFTER THE PROCEDURE After your procedure, it is typical to have the following:  Pain in your abdomen, especially at the incision sites. You will be given pain medicine to control the pain.  Tiredness. This is a normal part of the recovery process. Your energy level will return to normal over the next several weeks.  Constipation. You may be given stool softeners to prevent this. HOME CARE INSTRUCTIONS   Only take over-the-counter or prescription medicines as directed by your health care provider.  Ask your health care provider whether you may take a shower when you go home.  Remove or change any bandages (dressings) as directed.  You may resume a normal diet and activities as directed. Eat plenty of fruits and vegetables to help prevent constipation.  Drink enough fluids to keep your urine clear or pale yellow. This also helps prevent constipation.  Take rest breaks during the day as needed.  Avoid lifting anything heavier than 25 pounds (11.3 kg) or driving for 4 weeks or until your health care provider says it is okay.  Follow up with your health care provider as directed. Ask your health care provider when to make an appointment to get your stitches or staples removed. SEEK MEDICAL CARE IF:   You have increased bleeding from the incision areas.  You have redness, swelling, or increasing pain in the wounds.  You see pus coming from a wound.  You have a fever.  You notice a foul smell coming from the wound or dressing.  Your wound is breaking open (edges not staying together) after sutures or staples have been removed. SEEK IMMEDIATE MEDICAL CARE IF:  You develop a rash.  You have chest pain or difficulty breathing.  You have pain or swelling in your legs.  You have lightheadedness or feel faint.  Your abdomen becomes larger (distended).  You have nausea or  vomiting.  You have blood in your stools. Document Released: 01/29/2005 Document Revised: 05/02/2013 Document Reviewed: 02/21/2013 [facility] Patient Information 2015 [place], [place]. This information is not intended to replace advice given to you by your health care provider. Make sure you discuss any questions you have with your health care provider.

## 2014-02-06 ENCOUNTER — Encounter (HOSPITAL_COMMUNITY): Payer: Self-pay

## 2014-02-06 ENCOUNTER — Ambulatory Visit (HOSPITAL_COMMUNITY)
Admission: RE | Admit: 2014-02-06 | Discharge: 2014-02-06 | Disposition: A | Discharge status: Home / Self Care | Payer: BC Managed Care – PPO | Source: Ambulatory Visit | Attending: General Surgery | Admitting: General Surgery

## 2014-02-06 ENCOUNTER — Encounter (HOSPITAL_COMMUNITY)
Admission: RE | Admit: 2014-02-06 | Discharge: 2014-02-06 | Disposition: A | Discharge status: Home / Self Care | Payer: BC Managed Care – PPO | Source: Ambulatory Visit | Attending: General Surgery | Admitting: General Surgery

## 2014-02-06 ENCOUNTER — Encounter (HOSPITAL_COMMUNITY): Payer: Self-pay | Admitting: Pharmacy Technician

## 2014-02-06 DIAGNOSIS — Z01818 Encounter for other preprocedural examination: Secondary | ICD-10-CM | POA: Insufficient documentation

## 2014-02-06 HISTORY — DX: Malignant (primary) neoplasm, unspecified: C80.1

## 2014-02-06 LAB — CBC WITH DIFFERENTIAL/PLATELET
BASOS PCT: 1 % (ref 0–1)
Basophils Absolute: 0.1 10*3/uL (ref 0.0–0.1)
Eosinophils Absolute: 0.8 10*3/uL — ABNORMAL HIGH (ref 0.0–0.7)
Eosinophils Relative: 6 % — ABNORMAL HIGH (ref 0–5)
HCT: 35.4 % — ABNORMAL LOW (ref 39.0–52.0)
Hemoglobin: 11.6 g/dL — ABNORMAL LOW (ref 13.0–17.0)
Lymphocytes Relative: 21 % (ref 12–46)
Lymphs Abs: 2.5 10*3/uL (ref 0.7–4.0)
MCH: 30.4 pg (ref 26.0–34.0)
MCHC: 32.8 g/dL (ref 30.0–36.0)
MCV: 92.9 fL (ref 78.0–100.0)
MONO ABS: 0.9 10*3/uL (ref 0.1–1.0)
Monocytes Relative: 8 % (ref 3–12)
NEUTROS ABS: 7.6 10*3/uL (ref 1.7–7.7)
NEUTROS PCT: 64 % (ref 43–77)
Platelets: 357 10*3/uL (ref 150–400)
RBC: 3.81 MIL/uL — ABNORMAL LOW (ref 4.22–5.81)
RDW: 13.8 % (ref 11.5–15.5)
WBC: 11.8 10*3/uL — ABNORMAL HIGH (ref 4.0–10.5)

## 2014-02-06 LAB — COMPREHENSIVE METABOLIC PANEL
ALK PHOS: 83 U/L (ref 39–117)
ALT: 15 U/L (ref 0–53)
AST: 16 U/L (ref 0–37)
Albumin: 4.2 g/dL (ref 3.5–5.2)
Anion gap: 11 (ref 5–15)
BUN: 16 mg/dL (ref 6–23)
CHLORIDE: 98 meq/L (ref 96–112)
CO2: 29 mEq/L (ref 19–32)
CREATININE: 1.27 mg/dL (ref 0.50–1.35)
Calcium: 9.5 mg/dL (ref 8.4–10.5)
GFR calc Af Amer: 68 mL/min — ABNORMAL LOW (ref >90)
GFR, EST NON AFRICAN AMERICAN: 59 mL/min — AB (ref >90)
Glucose, Bld: 84 mg/dL (ref 70–99)
Potassium: 5 mEq/L (ref 3.7–5.3)
SODIUM: 138 meq/L (ref 137–147)
Total Bilirubin: 0.3 mg/dL (ref 0.3–1.2)
Total Protein: 7.1 g/dL (ref 6.0–8.3)

## 2014-02-06 LAB — PREPARE RBC (CROSSMATCH)

## 2014-02-07 LAB — CEA: CEA: 0.9 ng/mL (ref 0.0–5.0)

## 2014-02-10 LAB — ABO/RH: ABO/RH(D): A POS

## 2014-02-11 ENCOUNTER — Encounter (HOSPITAL_COMMUNITY): Payer: Self-pay | Admitting: *Deleted

## 2014-02-11 ENCOUNTER — Inpatient Hospital Stay (HOSPITAL_COMMUNITY): Payer: BC Managed Care – PPO | Admitting: Anesthesiology

## 2014-02-11 ENCOUNTER — Encounter (HOSPITAL_COMMUNITY)
Admission: RE | Disposition: A | Discharge status: Home / Self Care | Payer: Self-pay | Source: Ambulatory Visit | Attending: General Surgery

## 2014-02-11 ENCOUNTER — Inpatient Hospital Stay (HOSPITAL_COMMUNITY)
Admission: RE | Admit: 2014-02-11 | Discharge: 2014-02-14 | DRG: 331 | Disposition: A | Discharge status: Home / Self Care | Payer: BC Managed Care – PPO | Source: Ambulatory Visit | Attending: General Surgery | Admitting: General Surgery

## 2014-02-11 ENCOUNTER — Encounter (HOSPITAL_COMMUNITY): Payer: BC Managed Care – PPO | Admitting: Anesthesiology

## 2014-02-11 DIAGNOSIS — C185 Malignant neoplasm of splenic flexure: Principal | ICD-10-CM | POA: Diagnosis present

## 2014-02-11 DIAGNOSIS — E119 Type 2 diabetes mellitus without complications: Secondary | ICD-10-CM | POA: Diagnosis present

## 2014-02-11 DIAGNOSIS — C189 Malignant neoplasm of colon, unspecified: Secondary | ICD-10-CM | POA: Diagnosis present

## 2014-02-11 DIAGNOSIS — I10 Essential (primary) hypertension: Secondary | ICD-10-CM | POA: Diagnosis present

## 2014-02-11 DIAGNOSIS — E7800 Pure hypercholesterolemia, unspecified: Secondary | ICD-10-CM | POA: Diagnosis present

## 2014-02-11 DIAGNOSIS — I2510 Atherosclerotic heart disease of native coronary artery without angina pectoris: Secondary | ICD-10-CM | POA: Diagnosis present

## 2014-02-11 HISTORY — PX: COLON RESECTION: SHX5231

## 2014-02-11 HISTORY — PX: PARTIAL COLECTOMY: SHX5273

## 2014-02-11 LAB — GLUCOSE, CAPILLARY
GLUCOSE-CAPILLARY: 194 mg/dL — AB (ref 70–99)
Glucose-Capillary: 143 mg/dL — ABNORMAL HIGH (ref 70–99)

## 2014-02-11 SURGERY — COLECTOMY, HAND-ASSISTED, LAPAROSCOPIC
Anesthesia: General | Site: Abdomen

## 2014-02-11 MED ORDER — LIDOCAINE HCL (PF) 1 % IJ SOLN
INTRAMUSCULAR | Status: AC
  Filled 2014-02-11: qty 5

## 2014-02-11 MED ORDER — ONDANSETRON HCL 4 MG/2ML IJ SOLN
INTRAMUSCULAR | Status: AC
  Filled 2014-02-11: qty 2

## 2014-02-11 MED ORDER — SODIUM CHLORIDE 0.9 % IR SOLN
Status: DC | PRN
  Administered 2014-02-11: 2000 mL
  Administered 2014-02-11: 1000 mL

## 2014-02-11 MED ORDER — FENTANYL CITRATE 0.05 MG/ML IJ SOLN: 25.0000 ug | INTRAMUSCULAR | Status: DC | PRN

## 2014-02-11 MED ORDER — ENOXAPARIN SODIUM 40 MG/0.4ML ~~LOC~~ SOLN
40.0000 mg | SUBCUTANEOUS | Status: DC
  Administered 2014-02-12 – 2014-02-14 (×3): 40 mg via SUBCUTANEOUS
  Filled 2014-02-11 (×3): qty 0.4

## 2014-02-11 MED ORDER — DEXAMETHASONE SODIUM PHOSPHATE 4 MG/ML IJ SOLN
INTRAMUSCULAR | Status: AC
  Filled 2014-02-11: qty 1

## 2014-02-11 MED ORDER — ONDANSETRON HCL 4 MG/2ML IJ SOLN
4.0000 mg | Freq: Once | INTRAMUSCULAR | Status: AC
  Administered 2014-02-11: 4 mg via INTRAVENOUS

## 2014-02-11 MED ORDER — PANTOPRAZOLE SODIUM 40 MG PO TBEC
40.0000 mg | DELAYED_RELEASE_TABLET | Freq: Every day | ORAL | Status: DC
  Administered 2014-02-12 – 2014-02-14 (×3): 40 mg via ORAL
  Filled 2014-02-11 (×3): qty 1

## 2014-02-11 MED ORDER — SUFENTANIL CITRATE 50 MCG/ML IV SOLN
INTRAVENOUS | Status: AC
  Filled 2014-02-11: qty 1

## 2014-02-11 MED ORDER — SUFENTANIL CITRATE 50 MCG/ML IV SOLN
INTRAVENOUS | Status: DC | PRN
  Administered 2014-02-11 (×8): 10 ug via INTRAVENOUS

## 2014-02-11 MED ORDER — LABETALOL HCL 5 MG/ML IV SOLN
INTRAVENOUS | Status: AC
  Filled 2014-02-11: qty 4

## 2014-02-11 MED ORDER — GLYCOPYRROLATE 0.2 MG/ML IJ SOLN
INTRAMUSCULAR | Status: AC
  Filled 2014-02-11: qty 4

## 2014-02-11 MED ORDER — SODIUM CHLORIDE 0.9 % IR SOLN
Status: DC | PRN
  Administered 2014-02-11: 3000 mL

## 2014-02-11 MED ORDER — METFORMIN HCL 500 MG PO TABS
500.0000 mg | ORAL_TABLET | Freq: Two times a day (BID) | ORAL | Status: DC
  Administered 2014-02-11 – 2014-02-14 (×6): 500 mg via ORAL
  Filled 2014-02-11 (×6): qty 1

## 2014-02-11 MED ORDER — METOPROLOL SUCCINATE ER 50 MG PO TB24
50.0000 mg | ORAL_TABLET | Freq: Every day | ORAL | Status: DC
  Administered 2014-02-12 – 2014-02-14 (×3): 50 mg via ORAL
  Filled 2014-02-11 (×3): qty 1

## 2014-02-11 MED ORDER — MIDAZOLAM HCL 2 MG/2ML IJ SOLN
INTRAMUSCULAR | Status: AC
  Filled 2014-02-11: qty 2

## 2014-02-11 MED ORDER — BUPIVACAINE LIPOSOME 1.3 % IJ SUSP
INTRAMUSCULAR | Status: DC | PRN
  Administered 2014-02-11: 20 mL

## 2014-02-11 MED ORDER — PROPOFOL 10 MG/ML IV EMUL
INTRAVENOUS | Status: AC
Start: 2014-02-11 — End: 2014-02-11
  Filled 2014-02-11: qty 20

## 2014-02-11 MED ORDER — LACTATED RINGERS IV SOLN
INTRAVENOUS | Status: DC | PRN
  Administered 2014-02-11 (×4): via INTRAVENOUS

## 2014-02-11 MED ORDER — ARTIFICIAL TEARS OP OINT
TOPICAL_OINTMENT | OPHTHALMIC | Status: DC | PRN
  Administered 2014-02-11: 1 via OPHTHALMIC

## 2014-02-11 MED ORDER — DEXAMETHASONE SODIUM PHOSPHATE 4 MG/ML IJ SOLN
4.0000 mg | Freq: Once | INTRAMUSCULAR | Status: AC
  Administered 2014-02-11: 4 mg via INTRAVENOUS

## 2014-02-11 MED ORDER — KETOROLAC TROMETHAMINE 30 MG/ML IJ SOLN
30.0000 mg | Freq: Once | INTRAMUSCULAR | Status: AC
  Administered 2014-02-11: 30 mg via INTRAVENOUS
  Filled 2014-02-11: qty 1

## 2014-02-11 MED ORDER — POVIDONE-IODINE 10 % OINT PACKET
TOPICAL_OINTMENT | CUTANEOUS | Status: DC | PRN
  Administered 2014-02-11: 1 via TOPICAL

## 2014-02-11 MED ORDER — PHENYLEPHRINE HCL 10 MG/ML IJ SOLN
INTRAMUSCULAR | Status: AC
  Filled 2014-02-11: qty 1

## 2014-02-11 MED ORDER — ROCURONIUM BROMIDE 100 MG/10ML IV SOLN
INTRAVENOUS | Status: DC | PRN
  Administered 2014-02-11 (×3): 10 mg via INTRAVENOUS
  Administered 2014-02-11: 20 mg via INTRAVENOUS
  Administered 2014-02-11: 10 mg via INTRAVENOUS

## 2014-02-11 MED ORDER — ROCURONIUM BROMIDE 50 MG/5ML IV SOLN
INTRAVENOUS | Status: AC
  Filled 2014-02-11: qty 1

## 2014-02-11 MED ORDER — HYDROMORPHONE HCL PF 1 MG/ML IJ SOLN: 1.0000 mg | INTRAMUSCULAR | Status: DC | PRN

## 2014-02-11 MED ORDER — MIDAZOLAM HCL 2 MG/2ML IJ SOLN
1.0000 mg | INTRAMUSCULAR | Status: DC | PRN
  Administered 2014-02-11: 2 mg via INTRAVENOUS

## 2014-02-11 MED ORDER — BUPIVACAINE HCL (PF) 0.5 % IJ SOLN
INTRAMUSCULAR | Status: AC
  Filled 2014-02-11: qty 30

## 2014-02-11 MED ORDER — CHLORHEXIDINE GLUCONATE 4 % EX LIQD: 1.0000 "application " | Freq: Once | CUTANEOUS | Status: DC

## 2014-02-11 MED ORDER — GLYCOPYRROLATE 0.2 MG/ML IJ SOLN
INTRAMUSCULAR | Status: DC | PRN
  Administered 2014-02-11: 0.6 mg via INTRAVENOUS

## 2014-02-11 MED ORDER — LACTATED RINGERS IV SOLN
INTRAVENOUS | Status: DC
  Administered 2014-02-11: 15:00:00 via INTRAVENOUS

## 2014-02-11 MED ORDER — LIDOCAINE HCL (CARDIAC) 20 MG/ML IV SOLN
INTRAVENOUS | Status: DC | PRN
  Administered 2014-02-11: 50 mg via INTRAVENOUS

## 2014-02-11 MED ORDER — SODIUM CHLORIDE 0.9 % IJ SOLN
INTRAMUSCULAR | Status: AC
  Filled 2014-02-11: qty 10

## 2014-02-11 MED ORDER — BUPIVACAINE LIPOSOME 1.3 % IJ SUSP
INTRAMUSCULAR | Status: AC
  Filled 2014-02-11: qty 20

## 2014-02-11 MED ORDER — ALVIMOPAN 12 MG PO CAPS
12.0000 mg | ORAL_CAPSULE | Freq: Two times a day (BID) | ORAL | Status: DC
  Administered 2014-02-12 – 2014-02-14 (×5): 12 mg via ORAL
  Filled 2014-02-11 (×5): qty 1

## 2014-02-11 MED ORDER — CEFOTETAN DISODIUM-DEXTROSE 2-2.08 GM-% IV SOLR
2.0000 g | Freq: Once | INTRAVENOUS | Status: AC
  Administered 2014-02-11: 2 g via INTRAVENOUS
  Filled 2014-02-11: qty 50

## 2014-02-11 MED ORDER — LACTATED RINGERS IV SOLN
INTRAVENOUS | Status: DC
  Administered 2014-02-11: 10:00:00 via INTRAVENOUS

## 2014-02-11 MED ORDER — ONDANSETRON HCL 4 MG/2ML IJ SOLN: 4.0000 mg | Freq: Four times a day (QID) | INTRAMUSCULAR | Status: DC | PRN

## 2014-02-11 MED ORDER — ONDANSETRON HCL 4 MG/2ML IJ SOLN
4.0000 mg | Freq: Once | INTRAMUSCULAR | Status: DC | PRN
Start: 2014-02-11 — End: 2014-02-11

## 2014-02-11 MED ORDER — ENOXAPARIN SODIUM 40 MG/0.4ML ~~LOC~~ SOLN
40.0000 mg | Freq: Once | SUBCUTANEOUS | Status: AC
  Administered 2014-02-11: 40 mg via SUBCUTANEOUS

## 2014-02-11 MED ORDER — POVIDONE-IODINE 10 % EX OINT
TOPICAL_OINTMENT | CUTANEOUS | Status: AC
  Filled 2014-02-11: qty 2

## 2014-02-11 MED ORDER — SUCCINYLCHOLINE CHLORIDE 20 MG/ML IJ SOLN
INTRAMUSCULAR | Status: DC | PRN
  Administered 2014-02-11: 140 mg via INTRAVENOUS

## 2014-02-11 MED ORDER — ALVIMOPAN 12 MG PO CAPS
ORAL_CAPSULE | ORAL | Status: AC
  Filled 2014-02-11: qty 1

## 2014-02-11 MED ORDER — OXYCODONE-ACETAMINOPHEN 5-325 MG PO TABS
1.0000 | ORAL_TABLET | ORAL | Status: DC | PRN
  Administered 2014-02-11 – 2014-02-14 (×8): 1 via ORAL
  Filled 2014-02-11 (×8): qty 1

## 2014-02-11 MED ORDER — EPHEDRINE SULFATE 50 MG/ML IJ SOLN
INTRAMUSCULAR | Status: DC | PRN
  Administered 2014-02-11: 10 mg via INTRAVENOUS

## 2014-02-11 MED ORDER — NEOSTIGMINE METHYLSULFATE 10 MG/10ML IV SOLN
INTRAVENOUS | Status: AC
  Filled 2014-02-11: qty 2

## 2014-02-11 MED ORDER — ONDANSETRON HCL 4 MG PO TABS: 4.0000 mg | ORAL_TABLET | Freq: Four times a day (QID) | ORAL | Status: DC | PRN

## 2014-02-11 MED ORDER — SODIUM CHLORIDE 0.9 % IJ SOLN
INTRAMUSCULAR | Status: AC
  Filled 2014-02-11: qty 20

## 2014-02-11 MED ORDER — NEOSTIGMINE METHYLSULFATE 10 MG/10ML IV SOLN
INTRAVENOUS | Status: DC | PRN
  Administered 2014-02-11: 4 mg via INTRAVENOUS

## 2014-02-11 MED ORDER — PROPOFOL 10 MG/ML IV BOLUS
INTRAVENOUS | Status: DC | PRN
  Administered 2014-02-11: 150 mg via INTRAVENOUS

## 2014-02-11 MED ORDER — LABETALOL HCL 5 MG/ML IV SOLN
INTRAVENOUS | Status: DC | PRN
  Administered 2014-02-11 (×3): 5 mg via INTRAVENOUS

## 2014-02-11 MED ORDER — ENOXAPARIN SODIUM 40 MG/0.4ML ~~LOC~~ SOLN
SUBCUTANEOUS | Status: AC
  Filled 2014-02-11: qty 0.4

## 2014-02-11 MED ORDER — ALVIMOPAN 12 MG PO CAPS
12.0000 mg | ORAL_CAPSULE | Freq: Once | ORAL | Status: AC
  Administered 2014-02-11: 12 mg via ORAL

## 2014-02-11 SURGICAL SUPPLY — 68 items
BAG HAMPER (MISCELLANEOUS) ×3 IMPLANT
BLADE 10 SAFETY STRL DISP (BLADE) ×3 IMPLANT
BLADE HEX COATED 2.75 (ELECTRODE) ×3 IMPLANT
CHLORAPREP W/TINT 26ML (MISCELLANEOUS) ×3 IMPLANT
CLOTH BEACON ORANGE TIMEOUT ST (SAFETY) ×3 IMPLANT
COVER LIGHT HANDLE STERIS (MISCELLANEOUS) ×6 IMPLANT
COVER MAYO STAND XLG (DRAPE) ×3 IMPLANT
DRAPE INCISE IOBAN 44X35 STRL (DRAPES) ×3 IMPLANT
DRAPE PROXIMA HALF (DRAPES) ×3 IMPLANT
DRAPE WARM FLUID 44X44 (DRAPE) ×3 IMPLANT
DRSG OPSITE POSTOP 4X8 (GAUZE/BANDAGES/DRESSINGS) ×3 IMPLANT
ELECT BLADE 6 FLAT ULTRCLN (ELECTRODE) IMPLANT
ELECT REM PT RETURN 9FT ADLT (ELECTROSURGICAL) ×3
ELECTRODE REM PT RTRN 9FT ADLT (ELECTROSURGICAL) ×1 IMPLANT
FILTER SMOKE EVAC LAPAROSHD (FILTER) ×3 IMPLANT
GLOVE BIO SURGEON STRL SZ7 (GLOVE) ×15 IMPLANT
GLOVE BIOGEL PI IND STRL 7.0 (GLOVE) ×4 IMPLANT
GLOVE BIOGEL PI IND STRL 8.5 (GLOVE) ×1 IMPLANT
GLOVE BIOGEL PI INDICATOR 7.0 (GLOVE) ×8
GLOVE BIOGEL PI INDICATOR 8.5 (GLOVE) ×2
GLOVE EXAM NITRILE PF LG BLUE (GLOVE) ×6 IMPLANT
GLOVE SURG SS PI 7.5 STRL IVOR (GLOVE) ×12 IMPLANT
GOWN STRL REUS W/TWL LRG LVL3 (GOWN DISPOSABLE) ×21 IMPLANT
INST SET LAPROSCOPIC AP (KITS) ×3 IMPLANT
INST SET MAJOR GENERAL (KITS) ×3 IMPLANT
IV NS IRRIG 3000ML ARTHROMATIC (IV SOLUTION) ×3 IMPLANT
KIT ROOM TURNOVER AP CYSTO (KITS) IMPLANT
LIGASURE IMPACT 36 18CM CVD LR (INSTRUMENTS) ×3 IMPLANT
LIGASURE LAP ATLAS 10MM 37CM (INSTRUMENTS) ×3 IMPLANT
MANIFOLD NEPTUNE II (INSTRUMENTS) ×3 IMPLANT
NEEDLE HYPO 18GX1.5 BLUNT FILL (NEEDLE) ×3 IMPLANT
NS IRRIG 1000ML POUR BTL (IV SOLUTION) ×9 IMPLANT
PACK LAP CHOLE LZT030E (CUSTOM PROCEDURE TRAY) ×3 IMPLANT
PAD ARMBOARD 7.5X6 YLW CONV (MISCELLANEOUS) ×3 IMPLANT
PENCIL HANDSWITCHING (ELECTRODE) ×6 IMPLANT
RELOAD PROXIMATE 75MM BLUE (ENDOMECHANICALS) ×6 IMPLANT
SET BASIN LINEN APH (SET/KITS/TRAYS/PACK) ×3 IMPLANT
SET TUBE IRRIG SUCTION NO TIP (IRRIGATION / IRRIGATOR) ×3 IMPLANT
SHEET LAVH (DRAPES) ×3 IMPLANT
SPONGE GAUZE 2X2 8PLY STER LF (GAUZE/BANDAGES/DRESSINGS) ×2
SPONGE GAUZE 2X2 8PLY STRL LF (GAUZE/BANDAGES/DRESSINGS) ×4 IMPLANT
SPONGE LAP 18X18 X RAY DECT (DISPOSABLE) ×9 IMPLANT
STAPLER GUN LINEAR PROX 60 (STAPLE) ×3 IMPLANT
STAPLER PROXIMATE 75MM BLUE (STAPLE) ×3 IMPLANT
STAPLER VISISTAT (STAPLE) ×6 IMPLANT
SUCTION POOLE TIP (SUCTIONS) ×3 IMPLANT
SUT CHROMIC 0 CT 1 (SUTURE) IMPLANT
SUT CHROMIC 2 0 SH (SUTURE) IMPLANT
SUT PDS AB 0 CTX 60 (SUTURE) ×6 IMPLANT
SUT PDS AB CT VIOLET #0 27IN (SUTURE) IMPLANT
SUT SILK 2 0 (SUTURE)
SUT SILK 2-0 18XBRD TIE 12 (SUTURE) IMPLANT
SUT SILK 3 0 SH CR/8 (SUTURE) ×6 IMPLANT
SUT VIC AB 0 CT1 27 (SUTURE)
SUT VIC AB 0 CT1 27XCR 8 STRN (SUTURE) IMPLANT
SUT VIC AB 2-0 CT2 27 (SUTURE) IMPLANT
SUT VICRYL 0 UR6 27IN ABS (SUTURE) ×3 IMPLANT
SYR BULB IRRIGATION 50ML (SYRINGE) ×3 IMPLANT
SYRINGE 20CC LL (MISCELLANEOUS) ×3 IMPLANT
SYS LAPSCP GELPORT 120MM (MISCELLANEOUS) ×3
SYSTEM LAPSCP GELPORT 120MM (MISCELLANEOUS) ×1 IMPLANT
TAPE CLOTH SURG 4X10 WHT LF (GAUZE/BANDAGES/DRESSINGS) ×3 IMPLANT
TRAY FOLEY CATH 16FR SILVER (SET/KITS/TRAYS/PACK) ×3 IMPLANT
TROCAR ENDO BLADELESS 11MM (ENDOMECHANICALS) ×3 IMPLANT
TROCAR XCEL UNIV SLVE 11M 100M (ENDOMECHANICALS) ×3 IMPLANT
TUBING INSUF HEATED (TUBING) ×3 IMPLANT
WARMER LAPAROSCOPE (MISCELLANEOUS) ×3 IMPLANT
YANKAUER SUCT BULB TIP 10FT TU (MISCELLANEOUS) ×6 IMPLANT

## 2014-02-11 NOTE — Anesthesia Procedure Notes (Signed)
Procedure Name: Intubation Date/Time: 02/11/2014 10:03 AM Performed by: Andree Elk, Meighan Treto A Pre-anesthesia Checklist: Patient identified, Patient being monitored, Timeout performed, Emergency Drugs available and Suction available Patient Re-evaluated:Patient Re-evaluated prior to inductionOxygen Delivery Method: Circle System Utilized Preoxygenation: Pre-oxygenation with 100% oxygen Intubation Type: IV induction, Rapid sequence and Cricoid Pressure applied Laryngoscope Size: 3 and Miller Grade View: Grade I Tube type: Oral Tube size: 7.0 mm Number of attempts: 1 Airway Equipment and Method: stylet Placement Confirmation: ETT inserted through vocal cords under direct vision,  positive ETCO2 and breath sounds checked- equal and bilateral Secured at: 21 cm Tube secured with: Tape Dental Injury: Teeth and Oropharynx as per pre-operative assessment

## 2014-02-11 NOTE — Anesthesia Preprocedure Evaluation (Signed)
Anesthesia Evaluation  Patient identified by MRN, date of birth, ID band Patient awake    Reviewed: Allergy & Precautions, H&P , NPO status , Patient's Chart, lab work & pertinent test results, reviewed documented beta blocker date and time   Airway Mallampati: I TM Distance: >3 FB Neck ROM: Full    Dental  (+) Teeth Intact   Pulmonary sleep apnea , COPDformer smoker,  breath sounds clear to auscultation        Cardiovascular hypertension, Pt. on medications and Pt. on home beta blockers - angina+ CAD, + Past MI and + Cardiac Stents Rhythm:Regular Rate:Normal     Neuro/Psych    GI/Hepatic GERD-  Controlled and Medicated,  Endo/Other  diabetes, Well Controlled, Type 2, Oral Hypoglycemic AgentsMorbid obesity  Renal/GU      Musculoskeletal   Abdominal   Peds  Hematology   Anesthesia Other Findings   Reproductive/Obstetrics                           Anesthesia Physical Anesthesia Plan  ASA: III  Anesthesia Plan: General   Post-op Pain Management:    Induction: Intravenous, Rapid sequence and Cricoid pressure planned  Airway Management Planned: Oral ETT  Additional Equipment:   Intra-op Plan:   Post-operative Plan: Extubation in OR  Informed Consent: I have reviewed the patients History and Physical, chart, labs and discussed the procedure including the risks, benefits and alternatives for the proposed anesthesia with the patient or authorized representative who has indicated his/her understanding and acceptance.     Plan Discussed with:   Anesthesia Plan Comments:         Anesthesia Quick Evaluation

## 2014-02-11 NOTE — Anesthesia Postprocedure Evaluation (Signed)
  Anesthesia Post-op Note  Patient: Jose Burns  Procedure(s) Performed: Procedure(s): HAND ASSISTED LAPAROSCOPIC PARTIAL COLECTOMY WITH ANASTAMOSIS (N/A)  Patient Location: PACU  Anesthesia Type:General  Level of Consciousness: sedated and patient cooperative  Airway and Oxygen Therapy: Patient Spontanous Breathing and Patient connected to face mask oxygen  Post-op Pain: mild  Post-op Assessment: Post-op Vital signs reviewed, Patient's Cardiovascular Status Stable, Respiratory Function Stable, Patent Airway, No signs of Nausea or vomiting and Pain level controlled  Post-op Vital Signs: Reviewed and stable  Last Vitals:  Filed Vitals:   02/11/14 1258  BP: 150/55  Pulse: 86  Temp: 37 C  Resp: 10    Complications: No apparent anesthesia complications

## 2014-02-11 NOTE — OR Nursing (Signed)
2 units prbc available

## 2014-02-11 NOTE — Interval H&P Note (Signed)
History and Physical Interval Note:  02/11/2014 9:45 AM  Jose Burns  has presented today for surgery, with the diagnosis of colon cancer  The various methods of treatment have been discussed with the patient and family. After consideration of risks, benefits and other options for treatment, the patient has consented to  Procedure(s): HAND ASSISTED LAPAROSCOPIC PARTIAL COLECTOMY WITH ANASTAMOSIS (N/A) as a surgical intervention .  The patient's history has been reviewed, patient examined, no change in status, stable for surgery.  I have reviewed the patient's chart and labs.  Questions were answered to the patient's satisfaction.     Aviva Signs A

## 2014-02-11 NOTE — Op Note (Signed)
Patient:  Jose Burns  DOB:  05/12/1951  MRN:  [id number]   Preop Diagnosis:  Colon carcinoma  Postop Diagnosis:  Same  Procedure:  Hand-assisted laparoscopic partial colectomy with anastomosis  Surgeon:  Aviva Signs, M.D.  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 The patient now comes the operative room for laparoscopic hand-assisted left hemicolectomy. The risks and benefits of the procedure including bleeding, infection, cardiopulmonary difficulties, and a possibly of a blood transfusion were fully explained to the patient, who gave informed consent.  Procedure note:  The patient was placed in the lithotomy position after induction of general endotracheal anesthesia. The abdomen was prepped and draped using usual sterile technique with DuraPrep. Surgical site confirmation was performed.  A midline incision was made below the umbilicus. The dissection was taken down to the fascia. The peritoneal cavity was entered into without difficulty. A GelPort was then inserted. An additional 11 mm trocar was placed in the mid left abdomen and another one in the right upper quadrant of the abdomen. Both were placed under direct palpation without difficulty. The abdomen was then insufflated to 16 mm mercury pressure. The liver was inspected and no abnormal lesions were noted. On inspection of the splenic flexure, 2 areas of tattooing were identified. The descending colon as well as the splenic flexure were mobilized using the Endo Shears and the LigaSure. The middle colic artery was identified. The greater omentum off the stomach was identified and ligated with the LigaSure. A GIA 75 stapler was then placed across the midtransverse colon and fired. This was likewise done to the proximal sigmoid colon. Generous margins around the sigmoid colon were  found. The specimen was then marked with a silk suture for a distal margin. The specimen was sent to pathology further examination. A side to side proximal transverse colon to sigmoid colon anastomosis was then performed using a GIA 75 stapler. The colotomy was closed using a TA 60 stapler. The stapler was posterior using 3-0 silk sutures. The surrounding omentum was then placed over the anastomosis for additional protection. The abdominal cavity was then copiously are irrigated with normal saline. Once his fluid was evacuated, all operating personnel been changed the gown and gloves. A new drape and instruments setup was used. The descending colon mesentery was inspected no abnormal bleeding was noted. The fascia was reapproximated using a looped 0 PDS running suture. The subcutaneous layer was irrigated normal saline and all skin incisions were closed using staples.  Exparel was instilled in the surrounding incisions. Betadine ointment and dressed a dressings were applied.  All tape and needle counts were correct at the end of the procedure. Patient was extubated in the operating room and transferred to PACU in stable condition.  Complications:  None  EBL:  400 cc  Specimen:  Descending colon, suture distal

## 2014-02-11 NOTE — Progress Notes (Signed)
UR chart review completed.  

## 2014-02-11 NOTE — Transfer of Care (Signed)
Immediate Anesthesia Transfer of Care Note  Patient: Jose Burns  Procedure(s) Performed: Procedure(s): HAND ASSISTED LAPAROSCOPIC PARTIAL COLECTOMY WITH ANASTAMOSIS (N/A)  Patient Location: PACU  Anesthesia Type:General  Level of Consciousness: sedated and patient cooperative  Airway & Oxygen Therapy: Patient Spontanous Breathing and Patient connected to face mask oxygen  Post-op Assessment: Report given to PACU RN and Post -op Vital signs reviewed and stable  Post vital signs: Reviewed and stable  Complications: No apparent anesthesia complications

## 2014-02-12 LAB — GLUCOSE, CAPILLARY
GLUCOSE-CAPILLARY: 121 mg/dL — AB (ref 70–99)
GLUCOSE-CAPILLARY: 121 mg/dL — AB (ref 70–99)
GLUCOSE-CAPILLARY: 113 mg/dL — AB (ref 70–99)
Glucose-Capillary: 147 mg/dL — ABNORMAL HIGH (ref 70–99)

## 2014-02-12 LAB — CBC
HCT: 31.5 % — ABNORMAL LOW (ref 39.0–52.0)
Hemoglobin: 10.2 g/dL — ABNORMAL LOW (ref 13.0–17.0)
MCH: 29.7 pg (ref 26.0–34.0)
MCHC: 32.4 g/dL (ref 30.0–36.0)
MCV: 91.6 fL (ref 78.0–100.0)
PLATELETS: 361 10*3/uL (ref 150–400)
RBC: 3.44 MIL/uL — AB (ref 4.22–5.81)
RDW: 13.9 % (ref 11.5–15.5)
WBC: 16.1 10*3/uL — AB (ref 4.0–10.5)

## 2014-02-12 LAB — BASIC METABOLIC PANEL
Anion gap: 13 (ref 5–15)
BUN: 16 mg/dL (ref 6–23)
CHLORIDE: 98 meq/L (ref 96–112)
CO2: 25 mEq/L (ref 19–32)
CREATININE: 1.37 mg/dL — AB (ref 0.50–1.35)
Calcium: 8.7 mg/dL (ref 8.4–10.5)
GFR calc Af Amer: 62 mL/min — ABNORMAL LOW (ref >90)
GFR calc non Af Amer: 54 mL/min — ABNORMAL LOW (ref >90)
Glucose, Bld: 143 mg/dL — ABNORMAL HIGH (ref 70–99)
Potassium: 4.3 mEq/L (ref 3.7–5.3)
Sodium: 136 mEq/L — ABNORMAL LOW (ref 137–147)

## 2014-02-12 LAB — MAGNESIUM: Magnesium: 1.5 mg/dL (ref 1.5–2.5)

## 2014-02-12 LAB — PHOSPHORUS: PHOSPHORUS: 3.3 mg/dL (ref 2.3–4.6)

## 2014-02-12 MED ORDER — INSULIN ASPART 100 UNIT/ML ~~LOC~~ SOLN: 0.0000 [IU] | Freq: Every day | SUBCUTANEOUS | Status: DC

## 2014-02-12 MED ORDER — INSULIN ASPART 100 UNIT/ML ~~LOC~~ SOLN: 0.0000 [IU] | Freq: Three times a day (TID) | SUBCUTANEOUS | Status: DC

## 2014-02-12 NOTE — Care Management Note (Addendum)
    Page 1 of 1   02/14/2014     9:13:44 AM CARE MANAGEMENT NOTE 02/14/2014  Patient:  [facility]   Account Number:  [id number]  Date Initiated:  02/12/2014  Documentation initiated by:  Theophilus Kinds  Subjective/Objective Assessment:   Pt admitted from home s/p lap colectomy. Pt lives with his wife and will return home at discharge. Pt is independent with ADl's.     Action/Plan:   No Cm needs noted.   Anticipated DC Date:  02/14/2014   Anticipated DC Plan:  [place]  CM consult      Choice offered to / List presented to:             Status of service:  Completed, signed off Medicare Important Message given?   (If response is "NO", the following Medicare IM given date fields will be blank) Date Medicare IM given:   Medicare IM given by:   Date Additional Medicare IM given:   Additional Medicare IM given by:    Discharge Disposition:  HOME/SELF CARE  Per UR Regulation:    If discussed at Long Length of Stay Meetings, dates discussed:    Comments:  02/14/14 [place], RN BSN CM Pt discharged home today. No CM needs noted.  02/12/14 [place], RN BSN CM

## 2014-02-12 NOTE — Anesthesia Postprocedure Evaluation (Signed)
  Anesthesia Post-op Note  Patient: Jose Burns  Procedure(s) Performed: Procedure(s): HAND ASSISTED LAPAROSCOPIC PARTIAL COLECTOMY WITH ANASTAMOSIS (N/A)  Patient Location: room 202  Anesthesia Type:General  Level of Consciousness: awake, alert , oriented and patient cooperative  Airway and Oxygen Therapy: Patient Spontanous Breathing  Post-op Pain: 2 /10, mild  Post-op Assessment: Post-op Vital signs reviewed, Patient's Cardiovascular Status Stable, Respiratory Function Stable, Patent Airway and Pain level controlled  Post-op Vital Signs: Reviewed and stable  Last Vitals:  Filed Vitals:   02/12/14 1406  BP: 127/58  Pulse: 74  Temp: 37.1 C  Resp: 20    Complications: No apparent anesthesia complications

## 2014-02-12 NOTE — Addendum Note (Signed)
Addendum created 02/12/14 1440 by Charmaine Downs, CRNA   Modules edited: Notes Section   Notes Section:  File: [id number]

## 2014-02-12 NOTE — Progress Notes (Signed)
1 Day Post-Op  Subjective: Doing well. Minimal incisional pain. No bowel movement or flatus yet.  Objective: Vital signs in last 24 hours: Temp:  [97.6 F (36.4 C)-98.6 F (37 C)] 98.5 F (36.9 C) (07/21 0522) Pulse Rate:  [72-86] 79 (07/21 0522) Resp:  [10-29] 20 (07/21 0522) BP: (97-150)/(55-95) 121/67 mmHg (07/21 0522) SpO2:  [92 %-100 %] 94 % (07/21 0522) Weight:  [101.334 kg (223 lb 6.4 oz)] 101.334 kg (223 lb 6.4 oz) (07/20 2300) Last BM Date: 02/11/14  Intake/Output from previous day: 07/20 0701 - 07/21 0700 In: 5600 [P.O.:750; I.V.:4850] Out: 1450 [Urine:1050; Blood:400] Intake/Output this shift: Total I/O In: 240 [P.O.:240] Out: -   General appearance: alert, cooperative and no distress Resp: clear to auscultation bilaterally Cardio: regular rate and rhythm, S1, S2 normal, no murmur, click, rub or gallop GI: Soft. Dressings dry and intact.  Lab Results:   Recent Labs  02/12/14 0500  WBC 16.1*  HGB 10.2*  HCT 31.5*  PLT 361   BMET  Recent Labs  02/12/14 0500  NA 136*  K 4.3  CL 98  CO2 25  GLUCOSE 143*  BUN 16  CREATININE 1.37*  CALCIUM 8.7   PT/INR No results found for this basename: LABPROT, INR,  in the last 72 hours  Studies/Results: No results found.  Anti-infectives: Anti-infectives   Start     Dose/Rate Route Frequency Ordered Stop   02/11/14 0900  cefoTEtan in Dextrose 5% (CEFOTAN) IVPB 2 g     2 g Intravenous  Once 02/11/14 0847 02/11/14 1004      Assessment/Plan: s/p Procedure(s): HAND ASSISTED LAPAROSCOPIC PARTIAL COLECTOMY WITH ANASTAMOSIS Impression: Stable postoperative day one. Labs within normal limits postoperatively. Plan: We'll advance to full liquid diet. Continue to monitor blood glucoses. Final pathology pending.  LOS: 1 day    Odell Fasching A 02/12/2014

## 2014-02-12 NOTE — Progress Notes (Signed)
Pt. Refuses to have IV fluids running.  Notified MD.  Encouraging patient to drink fluids.  Will continue to monitor patient.

## 2014-02-13 ENCOUNTER — Encounter (HOSPITAL_COMMUNITY): Payer: Self-pay | Admitting: General Surgery

## 2014-02-13 LAB — BASIC METABOLIC PANEL
ANION GAP: 11 (ref 5–15)
BUN: 13 mg/dL (ref 6–23)
CO2: 30 mEq/L (ref 19–32)
Calcium: 9 mg/dL (ref 8.4–10.5)
Chloride: 98 mEq/L (ref 96–112)
Creatinine, Ser: 1.24 mg/dL (ref 0.50–1.35)
GFR calc Af Amer: 70 mL/min — ABNORMAL LOW (ref >90)
GFR, EST NON AFRICAN AMERICAN: 61 mL/min — AB (ref >90)
Glucose, Bld: 122 mg/dL — ABNORMAL HIGH (ref 70–99)
POTASSIUM: 4.2 meq/L (ref 3.7–5.3)
SODIUM: 139 meq/L (ref 137–147)

## 2014-02-13 LAB — CBC
HCT: 28.9 % — ABNORMAL LOW (ref 39.0–52.0)
Hemoglobin: 9.2 g/dL — ABNORMAL LOW (ref 13.0–17.0)
MCH: 29.9 pg (ref 26.0–34.0)
MCHC: 31.8 g/dL (ref 30.0–36.0)
MCV: 93.8 fL (ref 78.0–100.0)
PLATELETS: 311 10*3/uL (ref 150–400)
RBC: 3.08 MIL/uL — ABNORMAL LOW (ref 4.22–5.81)
RDW: 14.1 % (ref 11.5–15.5)
WBC: 15.7 10*3/uL — ABNORMAL HIGH (ref 4.0–10.5)

## 2014-02-13 LAB — GLUCOSE, CAPILLARY
GLUCOSE-CAPILLARY: 139 mg/dL — AB (ref 70–99)
Glucose-Capillary: 136 mg/dL — ABNORMAL HIGH (ref 70–99)
Glucose-Capillary: 122 mg/dL — ABNORMAL HIGH (ref 70–99)
Glucose-Capillary: 125 mg/dL — ABNORMAL HIGH (ref 70–99)

## 2014-02-13 NOTE — Progress Notes (Signed)
2 Days Post-Op  Subjective: No significant incisional pain. No flatus or bowel movement yet.  Objective: Vital signs in last 24 hours: Temp:  [98.5 F (36.9 C)-100.2 F (37.9 C)] 100.2 F (37.9 C) (07/22 0635) Pulse Rate:  [74-87] 87 (07/22 0635) Resp:  [20] 20 (07/22 0635) BP: (122-127)/(57-73) 122/57 mmHg (07/22 0635) SpO2:  [92 %-94 %] 92 % (07/22 0635) Last BM Date: 02/11/14  Intake/Output from previous day: 07/21 0701 - 07/22 0700 In: 960 [P.O.:960] Out: 2475 [Urine:2475] Intake/Output this shift:    General appearance: alert, cooperative and no distress Resp: clear to auscultation bilaterally Cardio: regular rate and rhythm, S1, S2 normal, no murmur, click, rub or gallop GI: Soft. Occasional bowel sounds appreciated. Incisions healing well.  Lab Results:   Recent Labs  02/12/14 0500 02/13/14 0536  WBC 16.1* 15.7*  HGB 10.2* 9.2*  HCT 31.5* 28.9*  PLT 361 311   BMET  Recent Labs  02/12/14 0500 02/13/14 0536  NA 136* 139  K 4.3 4.2  CL 98 98  CO2 25 30  GLUCOSE 143* 122*  BUN 16 13  CREATININE 1.37* 1.24  CALCIUM 8.7 9.0   PT/INR No results found for this basename: LABPROT, INR,  in the last 72 hours  Studies/Results: No results found.  Anti-infectives: Anti-infectives   Start     Dose/Rate Route Frequency Ordered Stop   02/11/14 0900  cefoTEtan in Dextrose 5% (CEFOTAN) IVPB 2 g     2 g Intravenous  Once 02/11/14 0847 02/11/14 1004      Assessment/Plan: s/p Procedure(s): HAND ASSISTED LAPAROSCOPIC PARTIAL COLECTOMY WITH ANASTAMOSIS Impression: Stable postoperative day 2 status post partial colectomy. Pathology is pending. A with mild leukocytosis. Awaiting return of bowel function. Plan: Continue full liquid diet we'll advance once bowel function returned.  LOS: 2 days    Jose Burns A 02/13/2014

## 2014-02-13 NOTE — Addendum Note (Signed)
Addendum created 02/13/14 1504 by Mickel Baas, CRNA   Modules edited: Notes Section   Notes Section:  File: [id number]

## 2014-02-13 NOTE — Anesthesia Postprocedure Evaluation (Signed)
  Anesthesia Post-op Note  Patient: Jose Burns  Procedure(s) Performed: Procedure(s): HAND ASSISTED LAPAROSCOPIC PARTIAL COLECTOMY WITH ANASTAMOSIS (N/A)  Patient Location: Room 202  Anesthesia Type:General  Level of Consciousness: awake, alert , oriented and patient cooperative  Airway and Oxygen Therapy: Patient Spontanous Breathing  Post-op Pain: mild  Post-op Assessment: Post-op Vital signs reviewed, Patient's Cardiovascular Status Stable, Respiratory Function Stable, Patent Airway, No signs of Nausea or vomiting and Pain level controlled  Post-op Vital Signs: Reviewed and stable  Last Vitals:  Filed Vitals:   02/13/14 1412  BP: 109/53  Pulse: 87  Temp: 37.1 C  Resp: 20    Complications: No apparent anesthesia complications

## 2014-02-14 LAB — CBC
HCT: 29.2 % — ABNORMAL LOW (ref 39.0–52.0)
Hemoglobin: 9.2 g/dL — ABNORMAL LOW (ref 13.0–17.0)
MCH: 29.8 pg (ref 26.0–34.0)
MCHC: 31.5 g/dL (ref 30.0–36.0)
MCV: 94.5 fL (ref 78.0–100.0)
PLATELETS: 338 10*3/uL (ref 150–400)
RBC: 3.09 MIL/uL — ABNORMAL LOW (ref 4.22–5.81)
RDW: 14.2 % (ref 11.5–15.5)
WBC: 15.5 10*3/uL — ABNORMAL HIGH (ref 4.0–10.5)

## 2014-02-14 LAB — GLUCOSE, CAPILLARY: Glucose-Capillary: 135 mg/dL — ABNORMAL HIGH (ref 70–99)

## 2014-02-14 MED ORDER — HYDROCODONE-ACETAMINOPHEN 5-325 MG PO TABS: 1.0000 | ORAL_TABLET | Freq: Four times a day (QID) | ORAL | Status: AC | PRN

## 2014-02-14 NOTE — Discharge Instructions (Signed)
Laparoscopic Colectomy, Care After Refer to this sheet in the next few weeks. These instructions provide you with information on caring for yourself after your procedure. Your health care provider may also give you more specific instructions. Your treatment has been planned according to current medical practices, but problems sometimes occur. Call your health care provider if you have any problems or questions after your procedure. WHAT TO EXPECT AFTER THE PROCEDURE After your procedure, it is typical to have the following:  Pain in your abdomen, especially at the incision sites. You will be given pain medicine to control the pain.  Tiredness. This is a normal part of the recovery process. Your energy level will return to normal over the next several weeks.  Constipation. You may be given stool softeners to prevent this. HOME CARE INSTRUCTIONS   Only take over-the-counter or prescription medicines as directed by your health care provider.  Ask your health care provider whether you may take a shower when you go home.  Remove or change any bandages (dressings) as directed.  You may resume a normal diet and activities as directed. Eat plenty of fruits and vegetables to help prevent constipation.  Drink enough fluids to keep your urine clear or pale yellow. This also helps prevent constipation.  Take rest breaks during the day as needed.  Avoid lifting anything heavier than 25 pounds (11.3 kg) or driving for 4 weeks or until your health care provider says it is okay.  Follow up with your health care provider as directed. Ask your health care provider when to make an appointment to get your stitches or staples removed. SEEK MEDICAL CARE IF:   You have increased bleeding from the incision areas.  You have redness, swelling, or increasing pain in the wounds.  You see pus coming from a wound.  You have a fever.  You notice a foul smell coming from the wound or dressing.  Your wound is  breaking open (edges not staying together) after sutures or staples have been removed. SEEK IMMEDIATE MEDICAL CARE IF:  You develop a rash.  You have chest pain or difficulty breathing.  You have pain or swelling in your legs.  You have lightheadedness or feel faint.  Your abdomen becomes larger (distended).  You have nausea or vomiting.  You have blood in your stools. Document Released: 01/29/2005 Document Revised: 05/02/2013 Document Reviewed: 02/21/2013 [facility] Patient Information 2015 [place], [place]. This information is not intended to replace advice given to you by your health care provider. Make sure you discuss any questions you have with your health care provider.

## 2014-02-14 NOTE — Discharge Summary (Signed)
Physician Discharge Summary  Patient ID: Jose Burns MRN: [id number] DOB/AGE: 1950-12-16 63 y.o.  Admit date: 02/11/2014 Discharge date: 02/14/2014  Admission Diagnoses: Colon carcinoma  Discharge Diagnoses: Same Active Problems:   Colon carcinoma   Discharged Condition: good  Hospital Course: Patient is a 63 year old white male who was found by Dr. Laural Golden on colonoscopy to have a colon carcinoma the splenic flexure. He underwent laparoscopic hand-assisted left hemicolectomy on 02/11/2014. Tolerated the procedure well. His postoperative course has been for the most part unremarkable. His diet was advanced without difficulty once his bowel function returned. Final pathology is still pending. He is being discharged home on 02/14/2014 good and improving condition.  Treatments: surgery: Laparoscopic hand-assisted partial colectomy on 02/11/2014  Discharge Exam: Blood pressure 115/67, pulse 80, temperature 98.8 F (37.1 C), temperature source Oral, resp. rate 20, height 5\' 10"  (1.778 m), weight 101.334 kg (223 lb 6.4 oz), SpO2 95.00%. General appearance: alert, cooperative and no distress Resp: clear to auscultation bilaterally Cardio: regular rate and rhythm, S1, S2 normal, no murmur, click, rub or gallop GI: Soft, dressings dry and intact. Incisions healing well  Disposition: 01-Home or Self Care     Medication List         acetaminophen 500 MG tablet  Commonly known as:  TYLENOL  Take 1,000 mg by mouth every 6 (six) hours as needed for mild pain. One by mouth daily as needed     atorvastatin 20 MG tablet  Commonly known as:  LIPITOR  Take 1 tablet (20 mg total) by mouth daily at 6 PM.     HYDROcodone-acetaminophen 5-325 MG per tablet  Commonly known as:  NORCO/VICODIN  Take 1-2 tablets by mouth every 6 (six) hours as needed for moderate pain.     metFORMIN 500 MG tablet  Commonly known as:  GLUCOPHAGE  Take 500 mg by mouth 2 (two) times daily with a meal.     metoprolol succinate 50 MG 24 hr tablet  Commonly known as:  TOPROL-XL  Take 1 tablet (50 mg total) by mouth daily.     pantoprazole 40 MG tablet  Commonly known as:  PROTONIX  Take 1 tablet (40 mg total) by mouth daily.           Follow-up Information   Follow up with Jamesetta So, MD. Schedule an appointment as soon as possible for a visit on 02/22/2014.   Specialty:  General Surgery   Contact information:   [address] [address] [phone]       Signed: Aviva Signs A 02/14/2014, 8:17 AM

## 2014-02-14 NOTE — Progress Notes (Signed)
Patient discharged home with wife.  IV removed - WNL.  Reviewed medications.  Instructed not to drive or do any heavy lifting until cleared by MD.  Instructed on incisional care and s/s of infection/when to call MD.  Follow up in place to have staples removed.  Patient verbalizes understanding of DC instructions.  Encouraged to use IS at home to prevent PNA.  Stable to DC at this time.  Left floor via WC with NT assist.

## 2014-02-14 NOTE — Progress Notes (Signed)
UR chart review completed.  

## 2014-02-16 LAB — TYPE AND SCREEN
ABO/RH(D): A POS
Antibody Screen: NEGATIVE
UNIT DIVISION: 0
UNIT DIVISION: 0
Unit division: 0

## 2014-02-19 ENCOUNTER — Other Ambulatory Visit (HOSPITAL_COMMUNITY)
Admission: RE | Admit: 2014-02-19 | Discharge: 2014-02-19 | Disposition: A | Discharge status: Home / Self Care | Payer: BC Managed Care – PPO | Source: Ambulatory Visit | Attending: General Surgery | Admitting: General Surgery

## 2014-02-19 DIAGNOSIS — C185 Malignant neoplasm of splenic flexure: Secondary | ICD-10-CM | POA: Insufficient documentation

## 2014-03-08 ENCOUNTER — Encounter (HOSPITAL_COMMUNITY): Payer: Self-pay

## 2014-05-05 ENCOUNTER — Other Ambulatory Visit: Payer: Self-pay | Admitting: Cardiovascular Disease

## 2014-11-05 ENCOUNTER — Other Ambulatory Visit (HOSPITAL_COMMUNITY): Payer: Self-pay | Admitting: Cardiovascular Disease

## 2015-02-14 ENCOUNTER — Other Ambulatory Visit: Payer: Self-pay | Admitting: *Deleted

## 2015-02-14 MED ORDER — METOPROLOL SUCCINATE ER 50 MG PO TB24: 50.0000 mg | ORAL_TABLET | Freq: Every day | ORAL | Status: DC

## 2015-02-14 MED ORDER — ATORVASTATIN CALCIUM 20 MG PO TABS: 20.0000 mg | ORAL_TABLET | Freq: Every day | ORAL | Status: DC

## 2015-02-20 ENCOUNTER — Other Ambulatory Visit (HOSPITAL_COMMUNITY): Payer: Self-pay | Admitting: Cardiology

## 2015-02-22 ENCOUNTER — Other Ambulatory Visit (HOSPITAL_COMMUNITY): Payer: Self-pay | Admitting: Cardiology

## 2015-03-21 ENCOUNTER — Other Ambulatory Visit: Payer: Self-pay | Admitting: *Deleted

## 2015-03-21 MED ORDER — ATORVASTATIN CALCIUM 20 MG PO TABS: 20.0000 mg | ORAL_TABLET | Freq: Every day | ORAL | Status: DC

## 2015-03-21 MED ORDER — METOPROLOL SUCCINATE ER 50 MG PO TB24: 50.0000 mg | ORAL_TABLET | Freq: Every day | ORAL | Status: DC

## 2015-03-24 ENCOUNTER — Other Ambulatory Visit: Payer: Self-pay | Admitting: Cardiovascular Disease

## 2015-03-26 ENCOUNTER — Telehealth (INDEPENDENT_AMBULATORY_CARE_PROVIDER_SITE_OTHER): Payer: Self-pay | Admitting: *Deleted

## 2015-03-26 NOTE — Telephone Encounter (Signed)
Patient had TCS 01/24/2014, partial colectomy a few weeks later, patient's wife called to schedule f/u TCS, I don't see a recommendation as to when he would be due -- please advise

## 2015-04-02 ENCOUNTER — Ambulatory Visit (INDEPENDENT_AMBULATORY_CARE_PROVIDER_SITE_OTHER): Payer: BLUE CROSS/BLUE SHIELD | Admitting: Cardiovascular Disease

## 2015-04-02 ENCOUNTER — Encounter: Payer: Self-pay | Admitting: Cardiovascular Disease

## 2015-04-02 VITALS — BP 119/70 | HR 68 | Ht 70.0 in | Wt 230.0 lb

## 2015-04-02 DIAGNOSIS — I2510 Atherosclerotic heart disease of native coronary artery without angina pectoris: Secondary | ICD-10-CM

## 2015-04-02 MED ORDER — ATORVASTATIN CALCIUM 20 MG PO TABS: 20.0000 mg | ORAL_TABLET | Freq: Every day | ORAL | Status: DC

## 2015-04-02 MED ORDER — METOPROLOL SUCCINATE ER 50 MG PO TB24: 50.0000 mg | ORAL_TABLET | Freq: Every day | ORAL | Status: DC

## 2015-04-02 NOTE — Progress Notes (Signed)
Cardiology Office Note   Date:  04/02/2015   ID:  Jose Burns, DOB 1950/12/25, MRN [id number]  PCP:  Lonzo Cloud, MD  Cardiologist:  Sherren Mocha, MD    Chief Complaint  Patient presents with  . Follow-up     History of Present Illness: Jose Burns is a 64 y.o. male who presents for  Follow-up of coronary artery disease. The patient lives in [place], [place].  He has been followed for CAD initially presented with non-ST elevation infarction in 2011. He underwent PCI of the LAD and left circumflex with drug-eluting stents. Other problems include hypertension, hyperlipidemia , obesity, and diabetes. The patient underwent surgery for colon cancer earlier this year and he did well. He has an upcoming surveillance colonoscopy planned. He did undergo nuclear stress testing last year which was normal.   The patient denies chest pain, chest pressure, edema, orthopnea, or PND. He's had no heart palpitations. He does have mild fatigue and shortness of breath with physical activity. He can do all of his normal activities without any problems.   Past Medical History  Diagnosis Date  . CAD (coronary artery disease)     s/p Non-ST-segment elevation myocardial infarction 05/27/2010; NSTEMI tx'd with Promus DES to LAD and Promus DES to OM2;  residual at cath 05/2010:  OM3 70% (small); D1 80%; CFx 40-50%; EF 55%  . Hyperlipidemia   . Polycythemia     with question of sleep apnea  . Obesity   . GERD (gastroesophageal reflux disease)   . HTN (hypertension)   . MI (myocardial infarction)   . Diabetes mellitus without complication   . COPD (chronic obstructive pulmonary disease)   . Diverticulosis   . Rectal bleeding     7/15  . Cancer     Past Surgical History  Procedure Laterality Date  . Shoulder surgery Left 2007  . Stents placed      x2  . Colonoscopy N/A 01/24/2014    Procedure: COLONOSCOPY;  Surgeon: Rogene Houston, MD;  Location: [facility];  Service: Endoscopy;   Laterality: N/A;  . Polypectomy  01/24/2014    Procedure: POLYPECTOMY;  Surgeon: Rogene Houston, MD;  Location: [facility];  Service: Endoscopy;;  . Esophageal biopsy  01/24/2014    Procedure: BIOPSY;  Surgeon: Rogene Houston, MD;  Location: [facility];  Service: Endoscopy;;  . Cardiac catheterization    . Back surgery      x2  . Partial colectomy  02/11/2014  . Colon resection N/A 02/11/2014    Procedure: HAND ASSISTED LAPAROSCOPIC PARTIAL COLECTOMY WITH ANASTAMOSIS;  Surgeon: Jamesetta So, MD;  Location: [facility];  Service: General;  Laterality: N/A;    Current Outpatient Prescriptions  Medication Sig Dispense Refill  . acetaminophen (TYLENOL) 500 MG tablet Take 1,000 mg by mouth every 6 (six) hours as needed for mild pain. One by mouth daily as needed    . atorvastatin (LIPITOR) 20 MG tablet Take 1 tablet (20 mg total) by mouth daily at 6 PM. 30 tablet 0  . metFORMIN (GLUCOPHAGE) 500 MG tablet Take 500 mg by mouth 2 (two) times daily with a meal.     . metoprolol succinate (TOPROL-XL) 50 MG 24 hr tablet Take 1 tablet (50 mg total) by mouth daily. Take with or immediately following a meal. 30 tablet 0   No current facility-administered medications for this visit.    Allergies:   Review of patient's allergies indicates no known allergies.   Social History:  The patient  reports that he quit smoking about 4 years ago. His smoking use included Cigarettes. He has a 35 pack-year smoking history. He does not have any smokeless tobacco history on file. He reports that he drinks alcohol. He reports that he does not use illicit drugs.   Family History:  The patient's  family history includes Breast cancer in his mother; Heart attack in his sister; Pneumonia in his father.    ROS:  Please see the history of present illness.  Otherwise, review of systems is positive for  Fatigue, easy bruising.  All other systems are reviewed and negative.    PHYSICAL EXAM: VS:  BP 119/70 mmHg  Pulse 68   Ht 5\' 10"  (1.778 m)  Wt 230 lb (104.327 kg)  BMI 33.00 kg/m2 , BMI Body mass index is 33 kg/(m^2). GEN: Well nourished, well developed,  Overweight malein no acute distress HEENT: normal Neck: no JVD, no masses. No carotid bruits Cardiac: RRR without murmur or gallop                Respiratory:  clear to auscultation bilaterally, normal work of breathing GI: soft, nontender, nondistended, + BS MS: no deformity or atrophy Ext: no pretibial edema, pedal pulses 2+= bilaterally Skin: warm and dry, no rash Neuro:  Strength and sensation are intact Psych: euthymic mood, full affect  EKG:  EKG is ordered today. The ekg ordered today shows  Normal sinus rhythm 68 bpm, within normal limits.  Recent Labs: No results found for requested labs within last 365 days.   Lipid Panel     Component Value Date/Time   CHOL 117 11/21/2013 1011   TRIG 191.0* 11/21/2013 1011   HDL 25.90* 11/21/2013 1011   CHOLHDL 5 11/21/2013 1011   VLDL 38.2 11/21/2013 1011   LDLCALC 53 11/21/2013 1011   LDLDIRECT 53.2 11/20/2010 0900      Wt Readings from Last 3 Encounters:  04/02/15 230 lb (104.327 kg)  02/11/14 223 lb 6.4 oz (101.334 kg)  02/06/14 225 lb 9.6 oz (102.331 kg)     Cardiac Studies Reviewed: Myoview 11/21/2013: QPS Raw Data Images: Normal; no motion artifact; normal heart/lung ratio. Stress Images: Normal homogeneous uptake in all areas of the myocardium. Rest Images: Normal homogeneous uptake in all areas of the myocardium. Subtraction (SDS): No evidence of ischemia. Transient Ischemic Dilatation (Normal <1.22): 0.94 Lung/Heart Ratio (Normal <0.45): 0.26  Quantitative Gated Spect Images QGS EDV: 99 ml QGS ESV: 38 ml  Impression Exercise Capacity: Lexiscan with low level exercise. BP Response: Normal blood pressure response. Clinical Symptoms: No significant symptoms noted. ECG Impression: No significant ST segment change suggestive of ischemia. Comparison with Prior  Nuclear Study: No images to compare  Overall Impression: Normal stress nuclear study.  LV Ejection Fraction: 58%. LV Wall Motion: NL LV Function; NL Wall Motion  ASSESSMENT AND PLAN: 1.   CAD, native vessel, without symptoms of angina: The patient is doing well now 5 years out from multivessel PCI. His stress test last year was negative. He will continue his current medical program without changes. He was counseled regarding the importance of lifestyle modification, exercise, and weight loss.  2. Essential hypertension: Blood pressure is controlled on metoprolol succinate.  3. Hyperlipidemia: The patient is treated with atorvastatin. Lab work reviewed with lipids at goal.   Current medicines are reviewed with the patient today.  The patient does not have concerns regarding medicines.  Labs/ tests ordered today include:  No orders of the defined  types were placed in this encounter.    Disposition:   FU  With PCP. Return here in 2 years for routine cardiology follow-up. Call if any problems arise.  Deatra James, MD  04/02/2015 12:49 PM    [address], [place], [place]  [address] Phone: [phone]; Fax: [phone]

## 2015-04-02 NOTE — Patient Instructions (Signed)
Medication Instructions:  Your physician recommends that you continue on your current medications as directed. Please refer to the Current Medication list given to you today.   Labwork: none  Testing/Procedures: none  Follow-Up: Your physician wants you to follow-up in: 2 years.  You will receive a reminder letter in the mail two months in advance. If you don't receive a letter, please call our office to schedule the follow-up appointment.   Any Other Special Instructions Will Be Listed Below (If Applicable).

## 2015-04-03 NOTE — Telephone Encounter (Signed)
Patient had invasive carcinoma in a polyp leading to surgery. Please schedule patient for colonoscopy

## 2015-04-07 ENCOUNTER — Telehealth (INDEPENDENT_AMBULATORY_CARE_PROVIDER_SITE_OTHER): Payer: Self-pay | Admitting: *Deleted

## 2015-04-07 ENCOUNTER — Other Ambulatory Visit (INDEPENDENT_AMBULATORY_CARE_PROVIDER_SITE_OTHER): Payer: Self-pay | Admitting: *Deleted

## 2015-04-07 DIAGNOSIS — Z85038 Personal history of other malignant neoplasm of large intestine: Secondary | ICD-10-CM

## 2015-04-07 NOTE — Telephone Encounter (Signed)
Patient needs trilyte 

## 2015-04-07 NOTE — Telephone Encounter (Signed)
TCS sch'd 05/22/15, patient aware

## 2015-04-08 MED ORDER — PEG 3350-KCL-NA BICARB-NACL 420 G PO SOLR: 4000.0000 mL | Freq: Once | ORAL | Status: DC

## 2015-04-14 IMAGING — CR DG CHEST 2V
2 series · 2 of 2 positions shown · non-contrast
Comparison: Prior chest x-ray 05/26/2010

CLINICAL DATA: Preoperative chest x-ray

EXAM:
CHEST  2 VIEW

[view not recorded (1 of 2)]
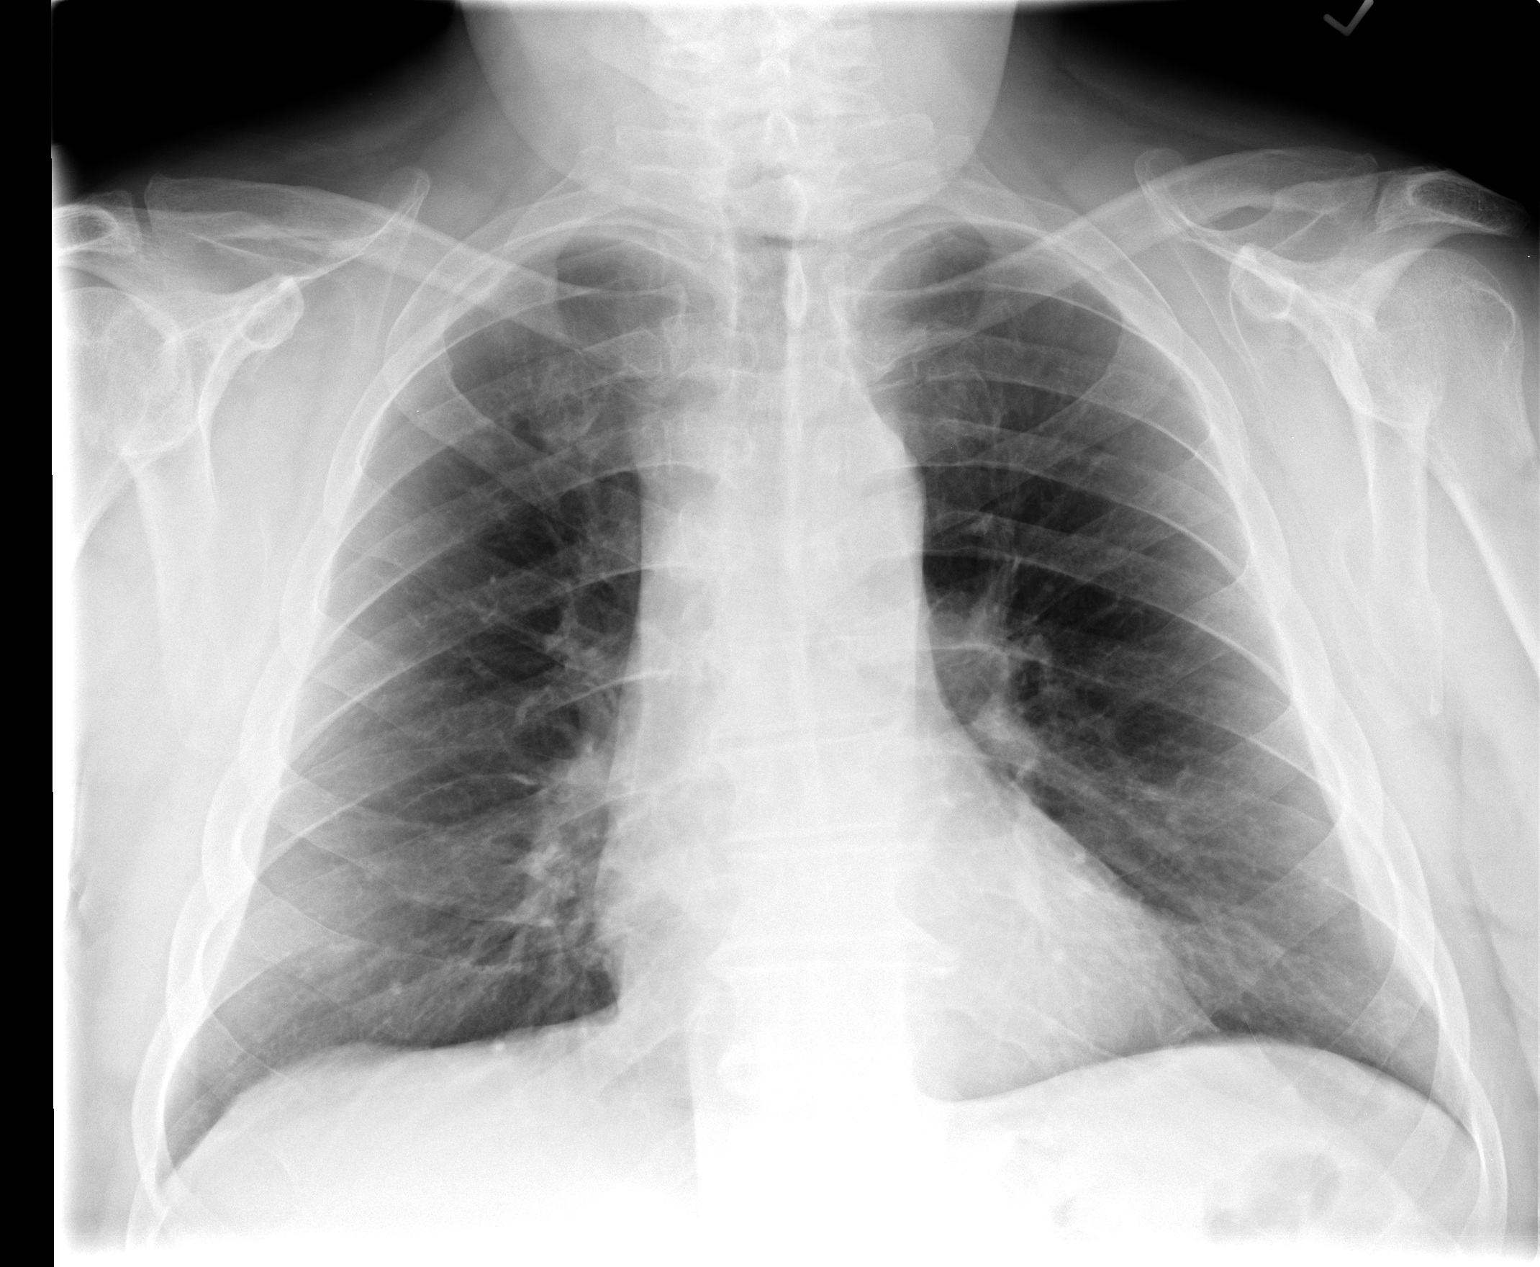

[view not recorded (2 of 2)]
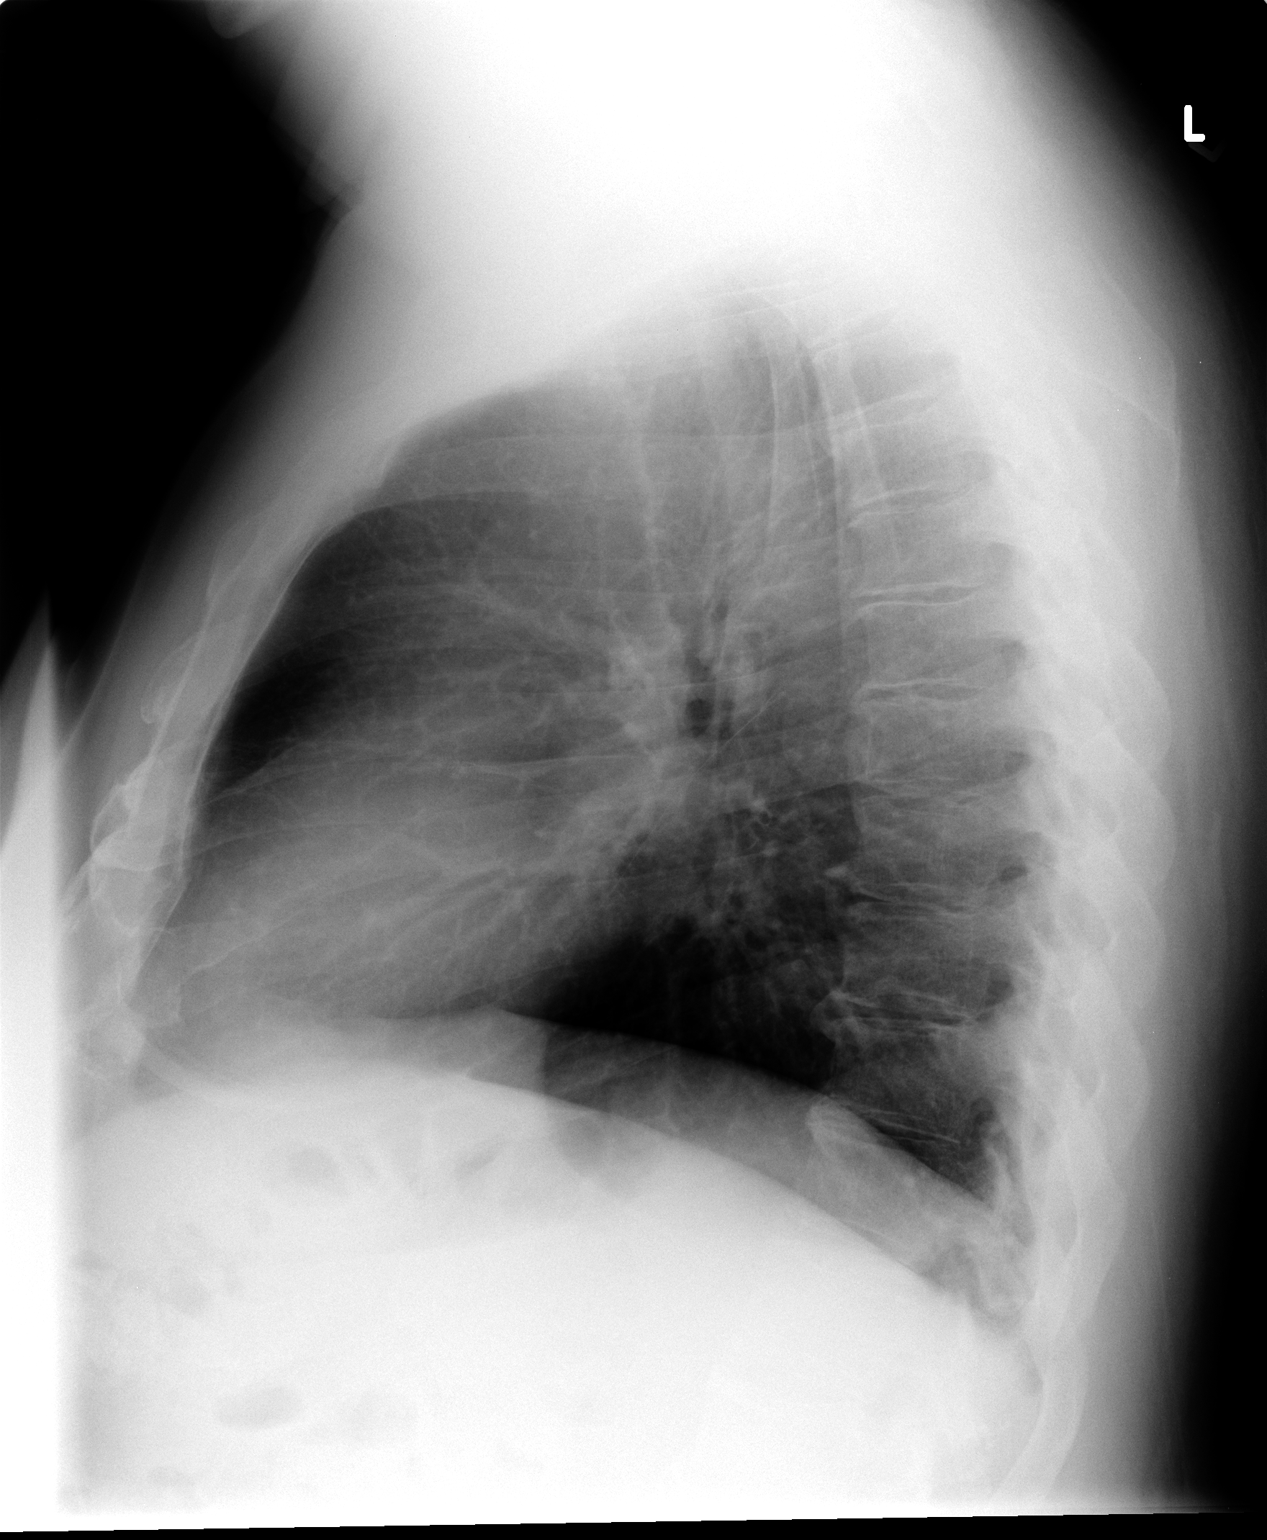

[2 of 2 positions shown; findings below may reference images not displayed]

FINDINGS: The lungs are clear and negative for focal airspace consolidation,
pulmonary edema or suspicious pulmonary nodule. No pleural effusion
or pneumothorax. Cardiac and mediastinal contours are within normal
limits. No acute fracture or lytic or blastic osseous lesions. The
visualized upper abdominal bowel gas pattern is unremarkable.
Degenerative endplate spurring in the mid and lower thoracic spine.
IMPRESSION: No active cardiopulmonary disease.

## 2015-04-18 ENCOUNTER — Other Ambulatory Visit: Payer: Self-pay | Admitting: Cardiovascular Disease

## 2015-04-24 ENCOUNTER — Telehealth (INDEPENDENT_AMBULATORY_CARE_PROVIDER_SITE_OTHER): Payer: Self-pay | Admitting: *Deleted

## 2015-04-24 NOTE — Telephone Encounter (Signed)
Referring MD/PCP: lazo   Procedure: tcs  Reason/Indication:  Hx colon ca  Has patient had this procedure before?  Yes, 2015 -- epic  If so, when, by whom and where?    Is there a family history of colon cancer?  no  Who?  What age when diagnosed?    Is patient diabetic?   yes      Does patient have prosthetic heart valve?  no  Do you have a pacemaker?  no  Has patient ever had endocarditis? no  Has patient had joint replacement within last 12 months?  no  Does patient tend to be constipated or take laxatives? no  Does patient have a history of alcohol/drug use? no  Is patient on Coumadin, Plavix and/or Aspirin? yes  Medications: asa 81 mg daily, lipitor 20 mg daily, metoprolol 50 mg daily, metformin 500 mg bid, tylenol prn  Allergies: nkda  Medication Adjustment: asa 2 days, hold metformin evening before and morning of  Procedure date & time: 05/22/15 at 830

## 2015-04-29 NOTE — Telephone Encounter (Signed)
agree

## 2015-05-22 ENCOUNTER — Encounter (HOSPITAL_COMMUNITY)
Admission: RE | Disposition: A | Discharge status: Home / Self Care | Payer: Self-pay | Source: Ambulatory Visit | Attending: Internal Medicine

## 2015-05-22 ENCOUNTER — Ambulatory Visit (HOSPITAL_COMMUNITY): Payer: BLUE CROSS/BLUE SHIELD

## 2015-05-22 ENCOUNTER — Ambulatory Visit (HOSPITAL_COMMUNITY)
Admission: RE | Admit: 2015-05-22 | Discharge: 2015-05-22 | Disposition: A | Discharge status: Home / Self Care | Payer: BLUE CROSS/BLUE SHIELD | Source: Ambulatory Visit | Attending: Internal Medicine | Admitting: Internal Medicine

## 2015-05-22 ENCOUNTER — Encounter (HOSPITAL_COMMUNITY): Payer: Self-pay | Admitting: *Deleted

## 2015-05-22 DIAGNOSIS — Z7982 Long term (current) use of aspirin: Secondary | ICD-10-CM | POA: Insufficient documentation

## 2015-05-22 DIAGNOSIS — I2510 Atherosclerotic heart disease of native coronary artery without angina pectoris: Secondary | ICD-10-CM | POA: Insufficient documentation

## 2015-05-22 DIAGNOSIS — E669 Obesity, unspecified: Secondary | ICD-10-CM | POA: Diagnosis not present

## 2015-05-22 DIAGNOSIS — Z9049 Acquired absence of other specified parts of digestive tract: Secondary | ICD-10-CM | POA: Diagnosis not present

## 2015-05-22 DIAGNOSIS — Z87891 Personal history of nicotine dependence: Secondary | ICD-10-CM | POA: Insufficient documentation

## 2015-05-22 DIAGNOSIS — K635 Polyp of colon: Secondary | ICD-10-CM

## 2015-05-22 DIAGNOSIS — D120 Benign neoplasm of cecum: Secondary | ICD-10-CM | POA: Insufficient documentation

## 2015-05-22 DIAGNOSIS — Z6831 Body mass index (BMI) 31.0-31.9, adult: Secondary | ICD-10-CM | POA: Insufficient documentation

## 2015-05-22 DIAGNOSIS — K5730 Diverticulosis of large intestine without perforation or abscess without bleeding: Secondary | ICD-10-CM | POA: Insufficient documentation

## 2015-05-22 DIAGNOSIS — J449 Chronic obstructive pulmonary disease, unspecified: Secondary | ICD-10-CM | POA: Insufficient documentation

## 2015-05-22 DIAGNOSIS — Z79899 Other long term (current) drug therapy: Secondary | ICD-10-CM | POA: Diagnosis not present

## 2015-05-22 DIAGNOSIS — K219 Gastro-esophageal reflux disease without esophagitis: Secondary | ICD-10-CM | POA: Insufficient documentation

## 2015-05-22 DIAGNOSIS — Z85038 Personal history of other malignant neoplasm of large intestine: Secondary | ICD-10-CM

## 2015-05-22 DIAGNOSIS — E785 Hyperlipidemia, unspecified: Secondary | ICD-10-CM | POA: Diagnosis not present

## 2015-05-22 DIAGNOSIS — Z1211 Encounter for screening for malignant neoplasm of colon: Secondary | ICD-10-CM | POA: Diagnosis not present

## 2015-05-22 DIAGNOSIS — I10 Essential (primary) hypertension: Secondary | ICD-10-CM | POA: Insufficient documentation

## 2015-05-22 DIAGNOSIS — D123 Benign neoplasm of transverse colon: Secondary | ICD-10-CM | POA: Insufficient documentation

## 2015-05-22 DIAGNOSIS — Z7984 Long term (current) use of oral hypoglycemic drugs: Secondary | ICD-10-CM | POA: Insufficient documentation

## 2015-05-22 DIAGNOSIS — E119 Type 2 diabetes mellitus without complications: Secondary | ICD-10-CM | POA: Insufficient documentation

## 2015-05-22 HISTORY — PX: COLONOSCOPY: SHX5424

## 2015-05-22 LAB — GLUCOSE, CAPILLARY: Glucose-Capillary: 149 mg/dL — ABNORMAL HIGH (ref 65–99)

## 2015-05-22 SURGERY — COLONOSCOPY
Anesthesia: Moderate Sedation

## 2015-05-22 MED ORDER — MIDAZOLAM HCL 5 MG/5ML IJ SOLN
INTRAMUSCULAR | Status: AC
  Filled 2015-05-22: qty 10

## 2015-05-22 MED ORDER — SODIUM CHLORIDE 0.9 % IV SOLN
INTRAVENOUS | Status: DC
  Administered 2015-05-22: 08:00:00 via INTRAVENOUS

## 2015-05-22 MED ORDER — MEPERIDINE HCL 50 MG/ML IJ SOLN
INTRAMUSCULAR | Status: DC | PRN
  Administered 2015-05-22 (×2): 25 mg via INTRAVENOUS

## 2015-05-22 MED ORDER — MIDAZOLAM HCL 5 MG/5ML IJ SOLN
INTRAMUSCULAR | Status: DC | PRN
  Administered 2015-05-22 (×5): 2 mg via INTRAVENOUS

## 2015-05-22 MED ORDER — MEPERIDINE HCL 50 MG/ML IJ SOLN
INTRAMUSCULAR | Status: AC
  Filled 2015-05-22: qty 1

## 2015-05-22 NOTE — H&P (Signed)
Jose Burns is an 64 y.o. male.   Chief Complaint: Patient is here for colonoscopy. HPI: Patient is 64 year old Caucasian male with history of colonic adenomas and colon carcinoma. He underwent left assisted partial colectomy in July last year. He had T1 N0 disease. He denies abdominal pain melena or rectal bleeding. Family history is negative for CRC.  Past Medical History  Diagnosis Date  . CAD (coronary artery disease)     s/p Non-ST-segment elevation myocardial infarction 05/27/2010; NSTEMI tx'd with Promus DES to LAD and Promus DES to OM2;  residual at cath 05/2010:  OM3 70% (small); D1 80%; CFx 40-50%; EF 55%  . Hyperlipidemia   . Polycythemia     with question of sleep apnea  . Obesity   . GERD (gastroesophageal reflux disease)   . HTN (hypertension)   . MI (myocardial infarction) ([place])   . Diabetes mellitus without complication ([place])   . COPD (chronic obstructive pulmonary disease) ([place])   . Diverticulosis   . Rectal bleeding     7/15  . Cancer [facility])     Past Surgical History  Procedure Laterality Date  . Shoulder surgery Left 2007  . Stents placed      x2  . Colonoscopy N/A 01/24/2014    Procedure: COLONOSCOPY;  Surgeon: Rogene Houston, MD;  [facility];  Service: Endoscopy;  Laterality: N/A;  . Polypectomy  01/24/2014    Procedure: POLYPECTOMY;  Surgeon: Rogene Houston, MD;  Location: [facility];  Service: Endoscopy;;  . Esophageal biopsy  01/24/2014    Procedure: BIOPSY;  Surgeon: Rogene Houston, MD;  Location: [facility];  Service: Endoscopy;;  . Cardiac catheterization    . Back surgery      x2  . Partial colectomy  02/11/2014  . Colon resection N/A 02/11/2014    Procedure: HAND ASSISTED LAPAROSCOPIC PARTIAL COLECTOMY WITH ANASTAMOSIS;  Surgeon: Jamesetta So, MD;  Location: [facility] ORS;  Service: General;  Laterality: N/A;    Family History  Problem Relation Age of Onset  . Pneumonia Father   . Heart attack Sister   . Breast cancer Mother     Social History:  reports that he quit smoking about 4 years ago. His smoking use included Cigarettes. He has a 35 pack-year smoking history. He does not have any smokeless tobacco history on file. He reports that he drinks alcohol. He reports that he does not use illicit drugs.  Allergies: No Known Allergies  Medications Prior to Admission  Medication Sig Dispense Refill  . acetaminophen (TYLENOL) 500 MG tablet Take 1,000 mg by mouth every 6 (six) hours as needed for mild pain. One by mouth daily as needed    . aspirin EC 81 MG tablet Take 81 mg by mouth daily.    Marland Kitchen atorvastatin (LIPITOR) 20 MG tablet Take 1 tablet (20 mg total) by mouth daily at 6 PM. 90 tablet 3  . metFORMIN (GLUCOPHAGE) 500 MG tablet Take 500 mg by mouth 2 (two) times daily with a meal.     . metoprolol succinate (TOPROL-XL) 50 MG 24 hr tablet Take 1 tablet (50 mg total) by mouth daily. Take with or immediately following a meal. 90 tablet 3  . polyethylene glycol-electrolytes (NULYTELY/GOLYTELY) 420 G solution Take 4,000 mLs by mouth once. 4000 mL 0    Results for orders placed or performed during the hospital encounter of 05/22/15 (from the past 48 hour(s))  Glucose, capillary     Status: Abnormal  Collection Time: 05/22/15  7:44 AM  Result Value Ref Range   Glucose-Capillary 149 (H) 65 - 99 mg/dL   No results found.  ROS  Blood pressure 139/89, pulse 80, temperature 98.3 F (36.8 C), temperature source Oral, resp. rate 15, height 5\' 10"  (1.778 m), weight 220 lb (99.791 kg), SpO2 97 %. Physical Exam  Constitutional: He appears well-developed and well-nourished.  HENT:  Mouth/Throat: Oropharynx is clear and moist.  Eyes: Conjunctivae are normal. No scleral icterus.  Neck: No thyromegaly present.  Cardiovascular: Normal rate, regular rhythm and normal heart sounds.   No murmur heard. Respiratory: Effort normal and breath sounds normal.  GI: Soft. He exhibits no distension and no mass. There is no tenderness.   Musculoskeletal: He exhibits no edema.  Lymphadenopathy:    He has no cervical adenopathy.  Neurological: He is alert.  Skin: Skin is warm and dry.     Assessment/Plan History of colon carcinoma and colonic adenomas. Surveillance colonoscopy.  REHMAN,NAJEEB U 05/22/2015, 8:28 AM

## 2015-05-22 NOTE — Op Note (Addendum)
COLONOSCOPY PROCEDURE REPORT  PATIENT:  Jose Burns  MR#:  [id number] Birthdate:  Jan 12, 1951, 64 y.o., male Endoscopist:  Dr. Rogene Houston, MD Referred By:  Dr. Lonzo Cloud, MD Procedure Date: 05/22/2015  Procedure:   Colonoscopy with snare polypectomy and clip application  Indications:  Patient is 64 year old Caucasian male who underwent colonoscopy in July 2015 and had 10 x 15 mm flat ulcerated lesion of splenic flexure. Histology revealed adenocarcinoma. He's undergone partial colectomy and now returns for surveillance colonoscopy. He denies abdominal pain or rectal bleeding. Family history is negative for CRC.  Informed Consent:  The procedure and risks were reviewed with the patient and informed consent was obtained.  Medications:  Demerol 50 mg IV Versed 10 mg IV  Description of procedure:  After a digital rectal exam was performed, that colonoscope was advanced from the anus through the rectum and colon to the area of the cecum, ileocecal valve and appendiceal orifice. The cecum was deeply intubated. These structures were well-seen and photographed for the record. From the level of the cecum and ileocecal valve, the scope was slowly and cautiously withdrawn. The mucosal surfaces were carefully surveyed utilizing scope tip to flexion to facilitate fold flattening as needed. The scope was pulled down into the rectum where a thorough exam including retroflexion was performed.  Findings:   Prep fair to satisfactory. Two small polyps were cold snared and submitted together. These were located at cecum and proximal transverse colon. 20 x 10 mm complex polyp noted at splenic flexure just proximal to the anastomosis. Part of this polyp was flat and part was polypoidal. Polyp was raised with saline injection and removed in 2 pieces. Residual polyp at right end coagulated with snare tip. Polypectomy felt to be complete. Two clips applied to close the gap and cover the raw area. Colonic  anastomosis noted just distal to this polyp. Scattered diverticula throughout the colon. Normal rectal mucosa and anorectal junction.  Therapeutic/Diagnostic Maneuvers Performed:  See above  Complications:  None  EBL:  None  Cecal Withdrawal Time:  52 minutes  Impression:  Examination performed to cecum. Pancolonic diverticulosis. 20 x 10 mm complex polyp snared from splenic flexure after elevating it with saline injection. Polyp removed in two pieces residual polyp ablated with snare tip. Polypectomy complete. Two clips applied to polypectomy site. This polyp was located just proximal to colonic anastomosis. Colonic anastomosis located just distal to this polyp.  Recommendations:  Standard instructions given. No aspirin or NSAIDs for 10 days. I will contact patient with biopsy results and further recommendations.  REHMAN,NAJEEB U  05/22/2015 9:45 AM  CC: Dr. Lonzo Cloud, MD & Dr. Rayne Du ref. provider found

## 2015-05-22 NOTE — Discharge Instructions (Signed)
No aspirin or NSAIDs for 10 days. Resume other medications and diet as before. No driving for 24 hours. Physician will call with biopsy results.  Colonoscopy, Care After These instructions give you information on caring for yourself after your procedure. Your doctor may also give you more specific instructions. Call your doctor if you have any problems or questions after your procedure. HOME CARE  Do not drive for 24 hours.  Do not sign important papers or use machinery for 24 hours.  You may shower.  You may go back to your usual activities, but go slower for the first 24 hours.  Take rest breaks often during the first 24 hours.  Walk around or use warm packs on your belly (abdomen) if you have belly cramping or gas.  Drink enough fluids to keep your pee (urine) clear or pale yellow.  Resume your normal diet. Avoid heavy or fried foods.  Avoid drinking alcohol for 24 hours or as told by your doctor.  Only take medicines as told by your doctor. If a tissue sample (biopsy) was taken during the procedure:   Do not take aspirin or blood thinners for 7 days, or as told by your doctor.  Do not drink alcohol for 7 days, or as told by your doctor.  Eat soft foods for the first 24 hours. GET HELP IF: You still have a small amount of blood in your poop (stool) 2-3 days after the procedure. GET HELP RIGHT AWAY IF:  You have more than a small amount of blood in your poop.  You see clumps of tissue (blood clots) in your poop.  Your belly is puffy (swollen).  You feel sick to your stomach (nauseous) or throw up (vomit).  You have a fever.  You have belly pain that gets worse and medicine does not help. MAKE SURE YOU:  Understand these instructions.  Will watch your condition.  Will get help right away if you are not doing well or get worse.   This information is not intended to replace advice given to you by your health care provider. Make sure you discuss any questions  you have with your health care provider.   Document Released: 08/14/2010 Document Revised: 07/17/2013 Document Reviewed: 03/19/2013 Elsevier Interactive Patient Education 2016 Elsevier Inc.  Colon Polyps Polyps are lumps of extra tissue growing inside the body. Polyps can grow in the large intestine (colon). Most colon polyps are noncancerous (benign). However, some colon polyps can become cancerous over time. Polyps that are larger than a pea may be harmful. To be safe, caregivers remove and test all polyps. CAUSES  Polyps form when mutations in the genes cause your cells to grow and divide even though no more tissue is needed. RISK FACTORS There are a number of risk factors that can increase your chances of getting colon polyps. They include:  Being older than 50 years.  Family history of colon polyps or colon cancer.  Long-term colon diseases, such as colitis or Crohn disease.  Being overweight.  Smoking.  Being inactive.  Drinking too much alcohol. SYMPTOMS  Most small polyps do not cause symptoms. If symptoms are present, they may include:  Blood in the stool. The stool may look dark red or black.  Constipation or diarrhea that lasts longer than 1 week. DIAGNOSIS People often do not know they have polyps until their caregiver finds them during a regular checkup. Your caregiver can use 4 tests to check for polyps:  Digital rectal exam. The caregiver  wears gloves and feels inside the rectum. This test would find polyps only in the rectum.  Barium enema. The caregiver puts a liquid called barium into your rectum before taking X-rays of your colon. Barium makes your colon look white. Polyps are dark, so they are easy to see in the X-ray pictures.  Sigmoidoscopy. A thin, flexible tube (sigmoidoscope) is placed into your rectum. The sigmoidoscope has a light and tiny camera in it. The caregiver uses the sigmoidoscope to look at the last third of your colon.  Colonoscopy. This  test is like sigmoidoscopy, but the caregiver looks at the entire colon. This is the most common method for finding and removing polyps. TREATMENT  Any polyps will be removed during a sigmoidoscopy or colonoscopy. The polyps are then tested for cancer. PREVENTION  To help lower your risk of getting more colon polyps:  Eat plenty of fruits and vegetables. Avoid eating fatty foods.  Do not smoke.  Avoid drinking alcohol.  Exercise every day.  Lose weight if recommended by your caregiver.  Eat plenty of calcium and folate. Foods that are rich in calcium include milk, cheese, and broccoli. Foods that are rich in folate include chickpeas, kidney beans, and spinach. HOME CARE INSTRUCTIONS Keep all follow-up appointments as directed by your caregiver. You may need periodic exams to check for polyps. SEEK MEDICAL CARE IF: You notice bleeding during a bowel movement.   This information is not intended to replace advice given to you by your health care provider. Make sure you discuss any questions you have with your health care provider.   Document Released: 04/07/2004 Document Revised: 08/02/2014 Document Reviewed: 09/21/2011 Elsevier Interactive Patient Education [employer].

## 2015-05-28 ENCOUNTER — Encounter (HOSPITAL_COMMUNITY): Payer: Self-pay | Admitting: Internal Medicine

## 2015-06-02 ENCOUNTER — Telehealth (INDEPENDENT_AMBULATORY_CARE_PROVIDER_SITE_OTHER): Payer: Self-pay | Admitting: *Deleted

## 2015-06-02 DIAGNOSIS — D126 Benign neoplasm of colon, unspecified: Secondary | ICD-10-CM

## 2015-06-02 DIAGNOSIS — D369 Benign neoplasm, unspecified site: Secondary | ICD-10-CM

## 2015-06-02 NOTE — Telephone Encounter (Signed)
Per Dr.Rehman the patient will need to have labs drawn the week of 06/02/15.

## 2015-06-04 LAB — CEA: CEA: 1.2 ng/mL (ref 0.0–5.0)

## 2015-06-09 ENCOUNTER — Telehealth (INDEPENDENT_AMBULATORY_CARE_PROVIDER_SITE_OTHER): Payer: Self-pay | Admitting: *Deleted

## 2015-06-09 DIAGNOSIS — D369 Benign neoplasm, unspecified site: Secondary | ICD-10-CM

## 2015-06-09 NOTE — Telephone Encounter (Signed)
Per Dr.Rehman the patient will need to have labs drawn in 6 months. 

## 2015-11-19 ENCOUNTER — Other Ambulatory Visit (INDEPENDENT_AMBULATORY_CARE_PROVIDER_SITE_OTHER): Payer: Self-pay | Admitting: *Deleted

## 2015-11-19 ENCOUNTER — Encounter (INDEPENDENT_AMBULATORY_CARE_PROVIDER_SITE_OTHER): Payer: Self-pay | Admitting: *Deleted

## 2015-11-19 DIAGNOSIS — D369 Benign neoplasm, unspecified site: Secondary | ICD-10-CM

## 2015-12-12 LAB — CEA: CEA: 0.8 ng/mL

## 2015-12-15 ENCOUNTER — Telehealth (INDEPENDENT_AMBULATORY_CARE_PROVIDER_SITE_OTHER): Payer: Self-pay | Admitting: *Deleted

## 2015-12-15 DIAGNOSIS — C189 Malignant neoplasm of colon, unspecified: Secondary | ICD-10-CM

## 2015-12-15 NOTE — Telephone Encounter (Signed)
Per Dr.Rehman the patient will need to have labs drawn in 6 months. 

## 2016-04-17 ENCOUNTER — Other Ambulatory Visit: Payer: Self-pay | Admitting: Cardiovascular Disease

## 2016-05-10 ENCOUNTER — Other Ambulatory Visit (INDEPENDENT_AMBULATORY_CARE_PROVIDER_SITE_OTHER): Payer: Self-pay | Admitting: *Deleted

## 2016-05-10 DIAGNOSIS — Z85038 Personal history of other malignant neoplasm of large intestine: Secondary | ICD-10-CM | POA: Insufficient documentation

## 2016-05-10 DIAGNOSIS — Z860100 Personal history of colon polyps, unspecified: Secondary | ICD-10-CM

## 2016-05-17 ENCOUNTER — Other Ambulatory Visit: Payer: Self-pay | Admitting: Cardiovascular Disease

## 2016-05-20 ENCOUNTER — Other Ambulatory Visit (INDEPENDENT_AMBULATORY_CARE_PROVIDER_SITE_OTHER): Payer: Self-pay | Admitting: *Deleted

## 2016-05-20 ENCOUNTER — Encounter (INDEPENDENT_AMBULATORY_CARE_PROVIDER_SITE_OTHER): Payer: Self-pay | Admitting: *Deleted

## 2016-05-20 DIAGNOSIS — C189 Malignant neoplasm of colon, unspecified: Secondary | ICD-10-CM

## 2016-05-28 ENCOUNTER — Telehealth (INDEPENDENT_AMBULATORY_CARE_PROVIDER_SITE_OTHER): Payer: Self-pay | Admitting: *Deleted

## 2016-05-28 ENCOUNTER — Encounter (INDEPENDENT_AMBULATORY_CARE_PROVIDER_SITE_OTHER): Payer: Self-pay | Admitting: *Deleted

## 2016-05-28 NOTE — Telephone Encounter (Signed)
Patient needs trilyte 

## 2016-05-31 MED ORDER — PEG 3350-KCL-NA BICARB-NACL 420 G PO SOLR: 4000.0000 mL | Freq: Once | ORAL | 0 refills | Status: AC

## 2016-07-25 ENCOUNTER — Encounter (HOSPITAL_COMMUNITY): Payer: Self-pay

## 2016-07-25 ENCOUNTER — Emergency Department (HOSPITAL_COMMUNITY)
Admission: EM | Admit: 2016-07-25 | Discharge: 2016-07-25 | Disposition: A | Discharge status: Home / Self Care | Payer: Medicare Other | Attending: Emergency Medicine | Admitting: Emergency Medicine

## 2016-07-25 DIAGNOSIS — Z85038 Personal history of other malignant neoplasm of large intestine: Secondary | ICD-10-CM | POA: Diagnosis not present

## 2016-07-25 DIAGNOSIS — I2510 Atherosclerotic heart disease of native coronary artery without angina pectoris: Secondary | ICD-10-CM | POA: Insufficient documentation

## 2016-07-25 DIAGNOSIS — J449 Chronic obstructive pulmonary disease, unspecified: Secondary | ICD-10-CM | POA: Insufficient documentation

## 2016-07-25 DIAGNOSIS — Z7982 Long term (current) use of aspirin: Secondary | ICD-10-CM | POA: Insufficient documentation

## 2016-07-25 DIAGNOSIS — E119 Type 2 diabetes mellitus without complications: Secondary | ICD-10-CM | POA: Diagnosis not present

## 2016-07-25 DIAGNOSIS — Z87891 Personal history of nicotine dependence: Secondary | ICD-10-CM | POA: Insufficient documentation

## 2016-07-25 DIAGNOSIS — Z79899 Other long term (current) drug therapy: Secondary | ICD-10-CM | POA: Insufficient documentation

## 2016-07-25 DIAGNOSIS — I10 Essential (primary) hypertension: Secondary | ICD-10-CM | POA: Diagnosis not present

## 2016-07-25 DIAGNOSIS — Z7984 Long term (current) use of oral hypoglycemic drugs: Secondary | ICD-10-CM | POA: Diagnosis not present

## 2016-07-25 DIAGNOSIS — R339 Retention of urine, unspecified: Secondary | ICD-10-CM | POA: Diagnosis not present

## 2016-07-25 DIAGNOSIS — B029 Zoster without complications: Secondary | ICD-10-CM | POA: Diagnosis not present

## 2016-07-25 LAB — BASIC METABOLIC PANEL
ANION GAP: 9 (ref 5–15)
BUN: 23 mg/dL — ABNORMAL HIGH (ref 6–20)
CALCIUM: 9.8 mg/dL (ref 8.9–10.3)
CO2: 27 mmol/L (ref 22–32)
Chloride: 98 mmol/L — ABNORMAL LOW (ref 101–111)
Creatinine, Ser: 1.08 mg/dL (ref 0.61–1.24)
Glucose, Bld: 114 mg/dL — ABNORMAL HIGH (ref 65–99)
Potassium: 4.6 mmol/L (ref 3.5–5.1)
SODIUM: 134 mmol/L — AB (ref 135–145)

## 2016-07-25 LAB — CBC WITH DIFFERENTIAL/PLATELET
Basophils Absolute: 0.1 10*3/uL (ref 0.0–0.1)
Basophils Relative: 1 %
EOS ABS: 0.4 10*3/uL (ref 0.0–0.7)
EOS PCT: 3 %
HCT: 47.4 % (ref 39.0–52.0)
Hemoglobin: 15.3 g/dL (ref 13.0–17.0)
LYMPHS ABS: 1.9 10*3/uL (ref 0.7–4.0)
Lymphocytes Relative: 15 %
MCH: 30.4 pg (ref 26.0–34.0)
MCHC: 32.3 g/dL (ref 30.0–36.0)
MCV: 94.2 fL (ref 78.0–100.0)
Monocytes Absolute: 1.1 10*3/uL — ABNORMAL HIGH (ref 0.1–1.0)
Monocytes Relative: 9 %
Neutro Abs: 9 10*3/uL — ABNORMAL HIGH (ref 1.7–7.7)
Neutrophils Relative %: 72 %
PLATELETS: 251 10*3/uL (ref 150–400)
RBC: 5.03 MIL/uL (ref 4.22–5.81)
RDW: 14.2 % (ref 11.5–15.5)
WBC: 12.5 10*3/uL — AB (ref 4.0–10.5)

## 2016-07-25 LAB — URINALYSIS, ROUTINE W REFLEX MICROSCOPIC
BACTERIA UA: NONE SEEN
Bilirubin Urine: NEGATIVE
Glucose, UA: NEGATIVE mg/dL
KETONES UR: NEGATIVE mg/dL
Nitrite: NEGATIVE
PROTEIN: NEGATIVE mg/dL
Specific Gravity, Urine: 1.013 (ref 1.005–1.030)
pH: 5 (ref 5.0–8.0)

## 2016-07-25 NOTE — ED Notes (Signed)
Leg bag applied to pt 

## 2016-07-25 NOTE — ED Triage Notes (Signed)
Reports of decreased urination since 07-19-16. Seen Thursday at urology for same complaint and given Flomax and doxycycline.  Also has rash on right hip, leg and right groin.

## 2016-07-25 NOTE — Discharge Instructions (Signed)
Keep the Foley catheter in place and to the leg bag when he gets full. Make an appointment to follow-up with your urologist again. Urinalysis today without evidence of urinary tract infection. Return for any new or worse symptoms.

## 2016-07-25 NOTE — ED Provider Notes (Signed)
[facility] DEPT Provider Note   CSN: [id number] Arrival date & time: 07/25/16  1730     History   Chief Complaint Chief Complaint  Patient presents with  . Urinary Retention    HPI Jose Burns is a 65 y.o. male.  Patient with difficulty urinating since December 22. Patient had some mild discomfort left leg than a rash broke out. Seen by his urologist this week. Started on Flomax and the antibiotic doxycycline. Patient's continued to have difficulty urinating. Urine sometimes leaks out on its own. Not able to go much at all but has a feeling to go frequently. No fevers. The rest of broke out on the leg originally had blisters now they're healed over and scabbed. No fevers. Some mild abdominal discomfort but no significant abdominal pain.      Past Medical History:  Diagnosis Date  . CAD (coronary artery disease)    s/p Non-ST-segment elevation myocardial infarction 05/27/2010; NSTEMI tx'd with Promus DES to LAD and Promus DES to OM2;  residual at cath 05/2010:  OM3 70% (small); D1 80%; CFx 40-50%; EF 55%  . Cancer ([place])   . COPD (chronic obstructive pulmonary disease) ([place])   . Diabetes mellitus without complication ([place])   . Diverticulosis   . GERD (gastroesophageal reflux disease)   . HTN (hypertension)   . Hyperlipidemia   . MI (myocardial infarction)   . Obesity   . Polycythemia    with question of sleep apnea  . Rectal bleeding    7/15    Patient Active Problem List   Diagnosis Date Noted  . Hx of colonic polyps 05/10/2016  . History of colon cancer 05/10/2016  . Colon carcinoma ([place]) 02/11/2014  . PRB (rectal bleeding) 01/23/2014  . Diverticulosis of colon 01/23/2014  . Diabetes ([place]) 01/23/2014  . Rectal bleeding 01/23/2014  . Polycythemia   . OBSTRUCTIVE SLEEP APNEA 06/17/2010  . HYPERTENSION 06/17/2010  . MYOCARDIAL INFARCTION 06/17/2010  . HYPERLIPIDEMIA 06/11/2010  . TOBACCO ABUSE 06/11/2010  . ESSENTIAL HYPERTENSION, BENIGN 06/11/2010  .  ACUT MI SUBENDOCARDIAL INFARCT EPIS CARE UNS 06/11/2010  . CORONARY ATHEROSCLEROSIS NATIVE CORONARY ARTERY 06/11/2010  . GERD 06/11/2010  . RISK OF SLEEP APNEA 06/11/2010    Past Surgical History:  Procedure Laterality Date  . BACK SURGERY     x2  . BIOPSY  01/24/2014   Procedure: BIOPSY;  Surgeon: Rogene Houston, MD;  Location[facility];  Service: Endoscopy;;  . CARDIAC CATHETERIZATION    . COLON RESECTION N/A 02/11/2014   Procedure: HAND ASSISTED LAPAROSCOPIC PARTIAL COLECTOMY WITH ANASTAMOSIS;  Surgeon: Jamesetta So, MD;  Location: [facility] ORS;  Service: General;  Laterality: N/A;  . COLONOSCOPY N/A 01/24/2014   Procedure: COLONOSCOPY;  Surgeon: Rogene Houston, MD;  [facility];  Service: Endoscopy;  Laterality: N/A;  . COLONOSCOPY N/A 05/22/2015   Procedure: COLONOSCOPY;  Surgeon: Rogene Houston, MD;  [facility];  Service: Endoscopy;  Laterality: N/A;  830  . PARTIAL COLECTOMY  02/11/2014  . POLYPECTOMY  01/24/2014   Procedure: POLYPECTOMY;  Surgeon: Rogene Houston, MD;  [facility];  Service: Endoscopy;;  . SHOULDER SURGERY Left 2007  . stents placed     x2       Home Medications    Prior to Admission medications   Medication Sig Start Date End Date Taking? Authorizing Provider  acetaminophen (TYLENOL) 500 MG tablet Take 1,000 mg by mouth every 6 (six) hours as needed for mild  pain. One by mouth daily as needed   Yes Historical Provider, MD  aspirin 81 MG tablet Take 81 mg by mouth daily.   Yes Historical Provider, MD  atorvastatin (LIPITOR) 20 MG tablet TAKE 1 TABLET BY MOUTH EVERY DAY AT 6 PM 05/18/16  Yes Sherren Mocha, MD  doxycycline (VIBRAMYCIN) 100 MG capsule Take 100 mg by mouth 2 (two) times daily.   Yes Historical Provider, MD  metFORMIN (GLUCOPHAGE) 500 MG tablet Take 500 mg by mouth 2 (two) times daily with a meal.  08/27/13  Yes Historical Provider, MD  metoprolol succinate (TOPROL-XL) 50 MG 24 hr tablet TAKE 1 TABLET BY  MOUTH EVERY DAY FOLLOWING A MEAL 05/18/16  Yes Sherren Mocha, MD  tamsulosin (FLOMAX) 0.4 MG CAPS capsule Take 0.4 mg by mouth daily.   Yes Historical Provider, MD    Family History Family History  Problem Relation Age of Onset  . Pneumonia Father   . Heart attack Sister   . Breast cancer Mother     Social History Social History  Substance Use Topics  . Smoking status: Former Smoker    Packs/day: 1.00    Years: 35.00    Types: Cigarettes    Quit date: 05/26/2010  . Smokeless tobacco: Never Used     Comment: smoked 0.5-1 ppd; started in 1970s and quit 05/26/2010  . Alcohol use Yes     Comment: rarely     Allergies   Patient has no known allergies.   Review of Systems Review of Systems  Constitutional: Negative for fever.  HENT: Negative for congestion.   Eyes: Negative for visual disturbance.  Respiratory: Negative for shortness of breath.   Cardiovascular: Negative for chest pain.  Gastrointestinal: Positive for abdominal distention and abdominal pain.  Genitourinary: Positive for difficulty urinating.  Musculoskeletal: Negative for back pain.  Neurological: Negative for headaches.  Hematological: Does not bruise/bleed easily.  Psychiatric/Behavioral: Negative for confusion.     Physical Exam Updated Vital Signs BP 133/80   Pulse 92   Temp 97.7 F (36.5 C)   Resp 20   Ht 5\' 10"  (1.778 m)   Wt 103.4 kg   SpO2 99%   BMI 32.71 kg/m   Physical Exam  Constitutional: He is oriented to person, place, and time. He appears well-developed and well-nourished. No distress.  HENT:  Head: Normocephalic and atraumatic.  Mouth/Throat: Oropharynx is clear and moist.  Eyes: EOM are normal. Pupils are equal, round, and reactive to light.  Neck: Normal range of motion.  Cardiovascular: Normal rate and regular rhythm.   Pulmonary/Chest: Effort normal and breath sounds normal.  Abdominal: Soft. Bowel sounds are normal. He exhibits distension. There is no tenderness.    Bladder distended.  Musculoskeletal: Normal range of motion. He exhibits no edema.  Neurological: He is alert and oriented to person, place, and time.  Skin: Rash noted.  Left leg distal lumbar dermatome rash healing scabs consistent with healing shingles. No vesicles. No secondary infection.  Nursing note and vitals reviewed.    ED Treatments / Results  Labs (all labs ordered are listed, but only abnormal results are displayed) Labs Reviewed  URINALYSIS, ROUTINE W REFLEX MICROSCOPIC - Abnormal; Notable for the following:       Result Value   Hgb urine dipstick LARGE (*)    Leukocytes, UA TRACE (*)    All other components within normal limits  BASIC METABOLIC PANEL - Abnormal; Notable for the following:    Sodium 134 (*)  Chloride 98 (*)    Glucose, Bld 114 (*)    BUN 23 (*)    All other components within normal limits  CBC WITH DIFFERENTIAL/PLATELET - Abnormal; Notable for the following:    WBC 12.5 (*)    Neutro Abs 9.0 (*)    Monocytes Absolute 1.1 (*)    All other components within normal limits  URINE CULTURE    EKG  EKG Interpretation None       Radiology No results found.  Procedures Procedures (including critical care time)  Medications Ordered in ED Medications - No data to display   Initial Impression / Assessment and Plan / ED Course  I have reviewed the triage vital signs and the nursing notes.  Pertinent labs & imaging results that were available during my care of the patient were reviewed by me and considered in my medical decision making (see chart for details).  Clinical Course     By bladder scan patient with significant urinary retention. About 900 mL. Foley catheter will be placed. Urinalysis with some hematuria but no evidence of any bacteria. Patient already on antibiotics by his urologist in [place]. Patient will keep leg bag in place and follow-up with urology. Renal function normal.  In addition patient's rash on the left side is  consistent with healing shingles. No open wounds no vesicles. Everything scabbed over. This could've been playing a role in with the initial urinary retention problem. Based on the dermatome distribution.  Final Clinical Impressions(s) / ED Diagnoses   Final diagnoses:  Urinary retention    New Prescriptions New Prescriptions   No medications on file     Fredia Sorrow, MD 07/25/16 2033

## 2016-07-27 LAB — URINE CULTURE: Culture: <10000 — AB

## 2016-07-29 ENCOUNTER — Encounter (INDEPENDENT_AMBULATORY_CARE_PROVIDER_SITE_OTHER): Payer: Self-pay | Admitting: *Deleted

## 2016-08-13 ENCOUNTER — Telehealth (INDEPENDENT_AMBULATORY_CARE_PROVIDER_SITE_OTHER): Payer: Self-pay | Admitting: *Deleted

## 2016-08-13 NOTE — Telephone Encounter (Signed)
Referring MD/PCP: wendy rodriguez   Procedure: tcs  Reason/Indication:  Hx polyps, hx colon ca  Has patient had this procedure before?  Yes, 2016  If so, when, by whom and where?    Is there a family history of colon cancer?  no  Who?  What age when diagnosed?    Is patient diabetic?   yes      Does patient have prosthetic heart valve or mechanical valve?  no  Do you have a pacemaker?  no  Has patient ever had endocarditis? no  Has patient had joint replacement within last 12 months?  no  Does patient tend to be constipated or take laxatives? no  Does patient have a history of alcohol/drug use?  no  Is patient on Coumadin, Plavix and/or Aspirin? yes  Medications: asa 81 mg daily, atorvastatin 20 mg daily, metoprolol 50 mg daily, metformin 500 mg bid (am/pm)  Allergies: nkda  Medication Adjustment: asa 2 days, hold metformin pm/am  Procedure date & time: 09/09/16 at 730

## 2016-08-16 NOTE — Telephone Encounter (Signed)
agree

## 2016-09-09 ENCOUNTER — Encounter (HOSPITAL_COMMUNITY)
Admission: RE | Disposition: A | Discharge status: Home / Self Care | Payer: Self-pay | Source: Ambulatory Visit | Attending: Internal Medicine

## 2016-09-09 ENCOUNTER — Ambulatory Visit (HOSPITAL_COMMUNITY)
Admission: RE | Admit: 2016-09-09 | Discharge: 2016-09-09 | Disposition: A | Discharge status: Home / Self Care | Payer: Medicare Other | Source: Ambulatory Visit | Attending: Internal Medicine | Admitting: Internal Medicine

## 2016-09-09 ENCOUNTER — Encounter (HOSPITAL_COMMUNITY): Payer: Self-pay

## 2016-09-09 DIAGNOSIS — Z7982 Long term (current) use of aspirin: Secondary | ICD-10-CM | POA: Diagnosis not present

## 2016-09-09 DIAGNOSIS — Z7984 Long term (current) use of oral hypoglycemic drugs: Secondary | ICD-10-CM | POA: Insufficient documentation

## 2016-09-09 DIAGNOSIS — K5730 Diverticulosis of large intestine without perforation or abscess without bleeding: Secondary | ICD-10-CM | POA: Diagnosis not present

## 2016-09-09 DIAGNOSIS — Z860100 Personal history of colon polyps, unspecified: Secondary | ICD-10-CM | POA: Insufficient documentation

## 2016-09-09 DIAGNOSIS — E785 Hyperlipidemia, unspecified: Secondary | ICD-10-CM | POA: Insufficient documentation

## 2016-09-09 DIAGNOSIS — E669 Obesity, unspecified: Secondary | ICD-10-CM | POA: Insufficient documentation

## 2016-09-09 DIAGNOSIS — Z980 Intestinal bypass and anastomosis status: Secondary | ICD-10-CM | POA: Insufficient documentation

## 2016-09-09 DIAGNOSIS — K644 Residual hemorrhoidal skin tags: Secondary | ICD-10-CM | POA: Diagnosis not present

## 2016-09-09 DIAGNOSIS — I10 Essential (primary) hypertension: Secondary | ICD-10-CM | POA: Insufficient documentation

## 2016-09-09 DIAGNOSIS — Z08 Encounter for follow-up examination after completed treatment for malignant neoplasm: Secondary | ICD-10-CM | POA: Diagnosis not present

## 2016-09-09 DIAGNOSIS — Z9889 Other specified postprocedural states: Secondary | ICD-10-CM | POA: Diagnosis not present

## 2016-09-09 DIAGNOSIS — I252 Old myocardial infarction: Secondary | ICD-10-CM | POA: Insufficient documentation

## 2016-09-09 DIAGNOSIS — J449 Chronic obstructive pulmonary disease, unspecified: Secondary | ICD-10-CM | POA: Diagnosis not present

## 2016-09-09 DIAGNOSIS — K219 Gastro-esophageal reflux disease without esophagitis: Secondary | ICD-10-CM | POA: Insufficient documentation

## 2016-09-09 DIAGNOSIS — Z1211 Encounter for screening for malignant neoplasm of colon: Secondary | ICD-10-CM | POA: Diagnosis present

## 2016-09-09 DIAGNOSIS — I2510 Atherosclerotic heart disease of native coronary artery without angina pectoris: Secondary | ICD-10-CM | POA: Insufficient documentation

## 2016-09-09 DIAGNOSIS — E119 Type 2 diabetes mellitus without complications: Secondary | ICD-10-CM | POA: Insufficient documentation

## 2016-09-09 DIAGNOSIS — Z85038 Personal history of other malignant neoplasm of large intestine: Secondary | ICD-10-CM | POA: Diagnosis not present

## 2016-09-09 DIAGNOSIS — Z79899 Other long term (current) drug therapy: Secondary | ICD-10-CM | POA: Insufficient documentation

## 2016-09-09 DIAGNOSIS — D123 Benign neoplasm of transverse colon: Secondary | ICD-10-CM | POA: Insufficient documentation

## 2016-09-09 HISTORY — PX: COLONOSCOPY: SHX5424

## 2016-09-09 LAB — GLUCOSE, CAPILLARY: Glucose-Capillary: 196 mg/dL — ABNORMAL HIGH (ref 65–99)

## 2016-09-09 SURGERY — COLONOSCOPY
Anesthesia: Moderate Sedation

## 2016-09-09 MED ORDER — MEPERIDINE HCL 50 MG/ML IJ SOLN
INTRAMUSCULAR | Status: DC | PRN
  Administered 2016-09-09 (×2): 25 mg via INTRAVENOUS

## 2016-09-09 MED ORDER — MEPERIDINE HCL 50 MG/ML IJ SOLN
INTRAMUSCULAR | Status: AC
  Filled 2016-09-09: qty 1

## 2016-09-09 MED ORDER — SODIUM CHLORIDE 0.9 % IV SOLN
INTRAVENOUS | Status: DC
  Administered 2016-09-09: 07:00:00 via INTRAVENOUS

## 2016-09-09 MED ORDER — MIDAZOLAM HCL 5 MG/5ML IJ SOLN
INTRAMUSCULAR | Status: DC | PRN
  Administered 2016-09-09 (×2): 1 mg via INTRAVENOUS
  Administered 2016-09-09 (×3): 2 mg via INTRAVENOUS
  Administered 2016-09-09: 1 mg via INTRAVENOUS

## 2016-09-09 MED ORDER — MIDAZOLAM HCL 5 MG/5ML IJ SOLN
INTRAMUSCULAR | Status: AC
  Filled 2016-09-09: qty 10

## 2016-09-09 NOTE — Discharge Instructions (Signed)
No aspirin or NSAIDs for 1 week. Resume other medications and high fiber diet. No driving for 24 hours. Physician will call with biopsy results and further recommendations.   Colonoscopy, Adult, Care After This sheet gives you information about how to care for yourself after your procedure. Your doctor may also give you more specific instructions. If you have problems or questions, call your doctor. Follow these instructions at home: General instructions  For the first 24 hours after the procedure:  Do not drive or use machinery.  Do not sign important documents.  Do not drink alcohol.  Do your daily activities more slowly than normal.  Eat foods that are soft and easy to digest.  Rest often.  Take over-the-counter or prescription medicines only as told by your doctor.  It is up to you to get the results of your procedure. Ask your doctor, or the department performing the procedure, when your results will be ready. To help cramping and bloating:  Try walking around.  Put heat on your belly (abdomen) as told by your doctor. Use a heat source that your doctor recommends, such as a moist heat pack or a heating pad.  Put a towel between your skin and the heat source.  Leave the heat on for 20-30 minutes.  Remove the heat if your skin turns bright red. This is especially important if you cannot feel pain, heat, or cold. You can get burned. Eating and drinking  Drink enough fluid to keep your pee (urine) clear or pale yellow.  Return to your normal diet as told by your doctor. Avoid heavy or fried foods that are hard to digest.  Avoid drinking alcohol for as long as told by your doctor. Contact a doctor if:  You have blood in your poop (stool) 2-3 days after the procedure. Get help right away if:  You have more than a small amount of blood in your poop.  You see large clumps of tissue (blood clots) in your poop.  Your belly is swollen.  You feel sick to your stomach  (nauseous).  You throw up (vomit).  You have a fever.  You have belly pain that gets worse, and medicine does not help your pain. This information is not intended to replace advice given to you by your health care provider. Make sure you discuss any questions you have with your health care provider. Document Released: 08/14/2010 Document Revised: 04/05/2016 Document Reviewed: 04/05/2016 Elsevier Interactive Patient Education  2017 [place].  Colon Polyps Introduction Polyps are tissue growths inside the body. Polyps can grow in many places, including the large intestine (colon). A polyp may be a round bump or a mushroom-shaped growth. You could have one polyp or several. Most colon polyps are noncancerous (benign). However, some colon polyps can become cancerous over time. What are the causes? The exact cause of colon polyps is not known. What increases the risk? This condition is more likely to develop in people who:  Have a family history of colon cancer or colon polyps.  Are older than 94 or older than 45 if they are African American.  Have inflammatory bowel disease, such as ulcerative colitis or Crohn disease.  Are overweight.  Smoke cigarettes.  Do not get enough exercise.  Drink too much alcohol.  Eat a diet that is:  High in fat and red meat.  Low in fiber.  Had childhood cancer that was treated with abdominal radiation. What are the signs or symptoms? Most polyps do not  cause symptoms. If you have symptoms, they may include:  Blood coming from your rectum when having a bowel movement.  Blood in your stool.The stool may look dark red or black.  A change in bowel habits, such as constipation or diarrhea. How is this diagnosed? This condition is diagnosed with a colonoscopy. This is a procedure that uses a lighted, flexible scope to look at the inside of your colon. How is this treated? Treatment for this condition involves removing any polyps that are  found. Those polyps will then be tested for cancer. If cancer is found, your health care provider will talk to you about options for colon cancer treatment. Follow these instructions at home: Diet  Eat plenty of fiber, such as fruits, vegetables, and whole grains.  Eat foods that are high in calcium and vitamin D, such as milk, cheese, yogurt, eggs, liver, fish, and broccoli.  Limit foods high in fat, red meats, and processed meats, such as hot dogs, sausage, bacon, and lunch meats.  Maintain a healthy weight, or lose weight if recommended by your health care provider. General instructions  Do not smoke cigarettes.  Do not drink alcohol excessively.  Keep all follow-up visits as told by your health care provider. This is important. This includes keeping regularly scheduled colonoscopies. Talk to your health care provider about when you need a colonoscopy.  Exercise every day or as told by your health care provider. Contact a health care provider if:  You have new or worsening bleeding during a bowel movement.  You have new or increased blood in your stool.  You have a change in bowel habits.  You unexpectedly lose weight. This information is not intended to replace advice given to you by your health care provider. Make sure you discuss any questions you have with your health care provider. Document Released: 04/07/2004 Document Revised: 12/18/2015 Document Reviewed: 06/02/2015  2017 Elsevier

## 2016-09-09 NOTE — Op Note (Addendum)
[facility] Patient Name: Jose Burns Procedure Date: 09/09/2016 7:33 AM MRN: [id number] Date of Birth: Jan 14, 1951 Attending MD: Hildred Laser , MD CSN: [id number] Age: 66 Admit Type: Outpatient Procedure:                Colonoscopy Indications:              High risk colon cancer surveillance: Personal                            history of colonic polyps, High risk colon cancer                            surveillance: Personal history of colon cancer Providers:                Hildred Laser, MD, Charlyne Petrin RN, RN, Sherlyn Lees, Technician Referring MD:             Alanda Amass, MD Medicines:                Meperidine 50 mg IV, Midazolam 9 mg IV Complications:            No immediate complications. Estimated Blood Loss:     Estimated blood loss was minimal. Procedure:                Pre-Anesthesia Assessment:                           - Prior to the procedure, a History and Physical                            was performed, and patient medications and                            allergies were reviewed. The patient's tolerance of                            previous anesthesia was also reviewed. The risks                            and benefits of the procedure and the sedation                            options and risks were discussed with the patient.                            All questions were answered, and informed consent                            was obtained. Prior Anticoagulants: The patient                            last took aspirin 5 days prior to the procedure.  ASA Grade Assessment: II - A patient with mild                            systemic disease. After reviewing the risks and                            benefits, the patient was deemed in satisfactory                            condition to undergo the procedure.                           After obtaining informed consent, the colonoscope                             was passed under direct vision. Throughout the                            procedure, the patient's blood pressure, pulse, and                            oxygen saturations were monitored continuously. The                            [id number]) scope was introduced through                            the anus and advanced to the the cecum, identified                            by appendiceal orifice and ileocecal valve. The                            colonoscopy was performed without difficulty. The                            patient tolerated the procedure well. The quality                            of the bowel preparation was adequate to identify                            polyps 6 mm and larger in size. The ileocecal                            valve, appendiceal orifice, and rectum were                            photographed. Scope In: [id number] AM Scope Out: 8:21:49 AM Scope Withdrawal Time: 0 hours 31 minutes 2 seconds  Total Procedure Duration: 0 hours 34 minutes 52 seconds  Findings:      The perianal exam findings include skin tags.      Multiple small and large-mouthed diverticula were found in the  entire       colon.      A 10 mm polyp was found in the transverse colon. The polyp was sessile       located on a redundant fold or a lipoma.. The polyp was removed with a       hot snare. Resection and retrieval were complete. The pathology specimen       was placed into Bottle Number 1.      A 4 mm polyp was found in the anastomosis. The polyp was sessile. The       polyp was removed with a cold snare. Resection and retrieval were       complete. The pathology specimen was placed into Bottle Number 2.      There was evidence of a prior end-to-end colo-colonic anastomosis at 45       cm proximal to the anus. This was patent.      The retroflexed view of the distal rectum and anal verge was normal and       showed no anal or rectal  abnormalities. Impression:               - Perianal skin tags found on perianal exam.                           - Diverticulosis in the entire examined colon.                           - One 10 mm polyp in the transverse colon on a                            redundant fold or a lipoma removed with a hot                            snare. Resected and retrieved.                           - One 4 mm polyp at the anastomosis, removed with a                            cold snare. Resected and retrieved.                           - Patent end-to-end colo-colonic anastomosis.                           - The distal rectum and anal verge are normal on                            retroflexion view. Moderate Sedation:      Moderate (conscious) sedation was administered by the endoscopy nurse       and supervised by the endoscopist. The following parameters were       monitored: oxygen saturation, heart rate, blood pressure, CO2       capnography and response to care. Total physician intraservice time was       40 minutes. Recommendation:           - Patient has a contact number available  for                            emergencies. The signs and symptoms of potential                            delayed complications were discussed with the                            patient. Return to normal activities tomorrow.                            Written discharge instructions were provided to the                            patient.                           - Patient has a contact number available for                            emergencies. The signs and symptoms of potential                            delayed complications were discussed with the                            patient. Return to normal activities tomorrow.                            Written discharge instructions were provided to the                            patient.                           - High fiber diet today.                            - Continue present medications.                           - No aspirin, ibuprofen, naproxen, or other                            non-steroidal anti-inflammatory drugs for 1 week                            after polyp removal.                           - Await pathology results.                           - Repeat colonoscopy for surveillance based on  pathology results. Procedure Code(s):        --- Professional ---                           [phone], Colonoscopy, flexible; with removal of                            tumor(s), polyp(s), or other lesion(s) by snare                            technique                           99152, Moderate sedation services provided by the                            same physician or other qualified health care                            professional performing the diagnostic or                            therapeutic service that the sedation supports,                            requiring the presence of an independent trained                            observer to assist in the monitoring of the                            patient's level of consciousness and physiological                            status; initial 15 minutes of intraservice time,                            patient age 43 years or older                           [phone], Moderate sedation services; each additional                            15 minutes intraservice time                           99153, Moderate sedation services; each additional                            15 minutes intraservice time Diagnosis Code(s):        --- Professional ---                           Z86.010, Personal history of colonic polyps  Z85.038, Personal history of other malignant                            neoplasm of large intestine                           D12.3, Benign neoplasm of transverse colon (hepatic                            flexure or splenic  flexure)                           Z98.0, Intestinal bypass and anastomosis status                           K64.4, Residual hemorrhoidal skin tags                           K57.30, Diverticulosis of large intestine without                            perforation or abscess without bleeding CPT copyright 2016 American [employer]. All rights reserved. The codes documented in this report are preliminary and upon coder review may  be revised to meet current compliance requirements. Hildred Laser, MD Hildred Laser, MD 09/09/2016 8:32:33 AM This report has been signed electronically. Number of Addenda: 0

## 2016-09-09 NOTE — H&P (Signed)
Jose Burns is an 66 y.o. male.   Chief Complaint: Patient is here for colonoscopy. HPI: This 66 year old Caucasian male with history of colon carcinoma and underwent lap assisted she'll colectomy in July 2015. He returned for surveillance colonoscopy in November 2016 and had large tubular adenoma with high-grade dysplasia proximal to anastomosis. This polyp was treated with combination of snare polypectomy and APC. He had a tubular adenoma adenoma removed. He recently became constipated when he developed UTI and did not eat. His bowels are back to normal. He denies abdominal pain or rectal bleeding. Family history is negative for CRC.  Past Medical History:  Diagnosis Date  . CAD (coronary artery disease)    s/p Non-ST-segment elevation myocardial infarction 05/27/2010; NSTEMI tx'd with Promus DES to LAD and Promus DES to OM2;  residual at cath 05/2010:  OM3 70% (small); D1 80%; CFx 40-50%; EF 55%  . Cancer ([place])   . COPD (chronic obstructive pulmonary disease) ([place])   . Diabetes mellitus without complication ([place])   . Diverticulosis   . GERD (gastroesophageal reflux disease)   . HTN (hypertension)   . Hyperlipidemia   . MI (myocardial infarction)   . Obesity   . Polycythemia    with question of sleep apnea  . Rectal bleeding    7/15    Past Surgical History:  Procedure Laterality Date  . BACK SURGERY     x2  . BIOPSY  01/24/2014   Procedure: BIOPSY;  Surgeon: Rogene Houston, MD;  Location: [facility];  Service: Endoscopy;;  . CARDIAC CATHETERIZATION    . COLON RESECTION N/A 02/11/2014   Procedure: HAND ASSISTED LAPAROSCOPIC PARTIAL COLECTOMY WITH ANASTAMOSIS;  Surgeon: Jamesetta So, MD;  Location: [facility];  Service: General;  Laterality: N/A;  . COLONOSCOPY N/A 01/24/2014   Procedure: COLONOSCOPY;  Surgeon: Rogene Houston, MD;  Location: [facility];  Service: Endoscopy;  Laterality: N/A;  . COLONOSCOPY N/A 05/22/2015   Procedure: COLONOSCOPY;  Surgeon: Rogene Houston,  MD;  Location: [facility];  Service: Endoscopy;  Laterality: N/A;  830  . PARTIAL COLECTOMY  02/11/2014  . POLYPECTOMY  01/24/2014   Procedure: POLYPECTOMY;  Surgeon: Rogene Houston, MD;  Location: [facility];  Service: Endoscopy;;  . SHOULDER SURGERY Left 2007  . stents placed     x2    Family History  Problem Relation Age of Onset  . Pneumonia Father   . Heart attack Sister   . Breast cancer Mother    Social History:  reports that he quit smoking about 6 years ago. His smoking use included Cigarettes. He has a 35.00 pack-year smoking history. He has never used smokeless tobacco. He reports that he drinks alcohol. He reports that he does not use drugs.  Allergies: No Known Allergies  Medications Prior to Admission  Medication Sig Dispense Refill  . acetaminophen (TYLENOL) 500 MG tablet Take 1,000 mg by mouth every 6 (six) hours as needed for mild pain. One by mouth daily as needed    . aspirin 81 MG tablet Take 81 mg by mouth daily.    Marland Kitchen atorvastatin (LIPITOR) 20 MG tablet TAKE 1 TABLET BY MOUTH EVERY DAY AT 6 PM 30 tablet 11  . metFORMIN (GLUCOPHAGE) 500 MG tablet Take 1,000 mg by mouth 2 (two) times daily with a meal.     . metoprolol succinate (TOPROL-XL) 50 MG 24 hr tablet TAKE 1 TABLET BY MOUTH EVERY DAY FOLLOWING A MEAL 30 tablet 11  .  tamsulosin (FLOMAX) 0.4 MG CAPS capsule Take 0.4 mg by mouth 2 (two) times daily.       Results for orders placed or performed during the hospital encounter of 09/09/16 (from the past 48 hour(s))  Glucose, capillary     Status: Abnormal   Collection Time: 09/09/16  7:02 AM  Result Value Ref Range   Glucose-Capillary 196 (H) 65 - 99 mg/dL   No results found.  ROS  Blood pressure 133/81, pulse (!) 102, temperature 99.2 F (37.3 C), temperature source Oral, resp. rate 14, height 5\' 10"  (1.778 m), weight 215 lb (97.5 kg), SpO2 97 %. Physical Exam  Constitutional: He appears well-developed and well-nourished.  HENT:  Mouth/Throat:  Oropharynx is clear and moist.  Eyes: Conjunctivae are normal. No scleral icterus.  Neck: No thyromegaly present.  Cardiovascular: Normal rate, regular rhythm and normal heart sounds.   No murmur heard. Respiratory: Effort normal and breath sounds normal.  GI:  Abdomen is full with small midline scar. Abdomen soft and nontender without organomegaly or masses.  Musculoskeletal: He exhibits no edema.  Lymphadenopathy:    He has no cervical adenopathy.  Neurological: He is alert.  Skin: Skin is warm and dry.     Assessment/Plan History of colon carcinoma and multiple adenomas. Surveillance colonoscopy.  Hildred Laser, MD 09/09/2016, 7:36 AM

## 2016-09-15 ENCOUNTER — Encounter (HOSPITAL_COMMUNITY): Payer: Self-pay | Admitting: Internal Medicine

## 2017-02-21 ENCOUNTER — Encounter (INDEPENDENT_AMBULATORY_CARE_PROVIDER_SITE_OTHER): Payer: Self-pay | Admitting: *Deleted

## 2019-08-27 DEATH — deceased
